# Patient Record
Sex: Female | Born: 1965
Health system: Southern US, Community
[De-identification: ages and names within clinical notes are randomized; demographics above are authoritative.]

## PROBLEM LIST (undated history)

## (undated) DIAGNOSIS — Z8719 Personal history of other diseases of the digestive system: Secondary | ICD-10-CM

## (undated) DIAGNOSIS — Z87440 Personal history of urinary (tract) infections: Secondary | ICD-10-CM

## (undated) DIAGNOSIS — N760 Acute vaginitis: Secondary | ICD-10-CM

## (undated) DIAGNOSIS — R011 Cardiac murmur, unspecified: Secondary | ICD-10-CM

## (undated) DIAGNOSIS — T7840XA Allergy, unspecified, initial encounter: Secondary | ICD-10-CM

## (undated) DIAGNOSIS — Z87898 Personal history of other specified conditions: Secondary | ICD-10-CM

## (undated) DIAGNOSIS — N943 Premenstrual tension syndrome: Secondary | ICD-10-CM

## (undated) DIAGNOSIS — B9689 Other specified bacterial agents as the cause of diseases classified elsewhere: Secondary | ICD-10-CM

## (undated) DIAGNOSIS — Z8619 Personal history of other infectious and parasitic diseases: Secondary | ICD-10-CM

## (undated) DIAGNOSIS — Z8742 Personal history of other diseases of the female genital tract: Secondary | ICD-10-CM

## (undated) DIAGNOSIS — B379 Candidiasis, unspecified: Secondary | ICD-10-CM

## (undated) DIAGNOSIS — N898 Other specified noninflammatory disorders of vagina: Secondary | ICD-10-CM

## (undated) HISTORY — DX: Personal history of other infectious and parasitic diseases: Z86.19

## (undated) HISTORY — DX: Personal history of other diseases of the digestive system: Z87.19

## (undated) HISTORY — DX: Acute vaginitis: N76.0

## (undated) HISTORY — DX: Candidiasis, unspecified: B37.9

## (undated) HISTORY — DX: Personal history of other diseases of the female genital tract: Z87.42

## (undated) HISTORY — DX: Personal history of urinary (tract) infections: Z87.440

## (undated) HISTORY — DX: Personal history of other specified conditions: Z87.898

## (undated) HISTORY — DX: Other specified bacterial agents as the cause of diseases classified elsewhere: B96.89

## (undated) HISTORY — DX: Premenstrual tension syndrome: N94.3

## (undated) HISTORY — DX: Other specified noninflammatory disorders of vagina: N89.8

## (undated) HISTORY — DX: Allergy, unspecified, initial encounter: T78.40XA

## (undated) HISTORY — DX: Cardiac murmur, unspecified: R01.1

---

## 1997-10-01 ENCOUNTER — Other Ambulatory Visit: Admission: RE | Admit: 1997-10-01 | Discharge: 1997-10-01 | Payer: Self-pay | Admitting: Obstetrics and Gynecology

## 1998-03-22 HISTORY — PX: DILATION AND CURETTAGE OF UTERUS: SHX78

## 1998-10-21 HISTORY — PX: WISDOM TOOTH EXTRACTION: SHX21

## 1998-10-23 ENCOUNTER — Other Ambulatory Visit: Admission: RE | Admit: 1998-10-23 | Discharge: 1998-10-23 | Payer: Self-pay | Admitting: Obstetrics and Gynecology

## 1999-01-02 ENCOUNTER — Ambulatory Visit (HOSPITAL_COMMUNITY): Admission: RE | Admit: 1999-01-02 | Discharge: 1999-01-02 | Payer: Self-pay | Admitting: *Deleted

## 1999-01-02 ENCOUNTER — Encounter (INDEPENDENT_AMBULATORY_CARE_PROVIDER_SITE_OTHER): Payer: Self-pay | Admitting: Specialist

## 1999-06-26 ENCOUNTER — Ambulatory Visit (HOSPITAL_COMMUNITY): Admission: RE | Admit: 1999-06-26 | Discharge: 1999-06-26 | Payer: Self-pay | Admitting: *Deleted

## 2000-03-22 DIAGNOSIS — N898 Other specified noninflammatory disorders of vagina: Secondary | ICD-10-CM

## 2000-03-22 HISTORY — DX: Other specified noninflammatory disorders of vagina: N89.8

## 2000-07-22 ENCOUNTER — Inpatient Hospital Stay (HOSPITAL_COMMUNITY): Admission: AD | Admit: 2000-07-22 | Discharge: 2000-07-22 | Payer: Self-pay | Admitting: *Deleted

## 2000-07-22 ENCOUNTER — Inpatient Hospital Stay (HOSPITAL_COMMUNITY): Admission: AD | Admit: 2000-07-22 | Discharge: 2000-07-25 | Payer: Self-pay | Admitting: Obstetrics and Gynecology

## 2001-03-22 DIAGNOSIS — N760 Acute vaginitis: Secondary | ICD-10-CM

## 2001-03-22 DIAGNOSIS — B9689 Other specified bacterial agents as the cause of diseases classified elsewhere: Secondary | ICD-10-CM

## 2001-03-22 HISTORY — DX: Other specified bacterial agents as the cause of diseases classified elsewhere: B96.89

## 2001-03-22 HISTORY — DX: Other specified bacterial agents as the cause of diseases classified elsewhere: N76.0

## 2001-10-20 DIAGNOSIS — Z8742 Personal history of other diseases of the female genital tract: Secondary | ICD-10-CM

## 2001-10-20 HISTORY — DX: Personal history of other diseases of the female genital tract: Z87.42

## 2001-12-29 ENCOUNTER — Ambulatory Visit (HOSPITAL_COMMUNITY): Admission: RE | Admit: 2001-12-29 | Discharge: 2001-12-29 | Payer: Self-pay | Admitting: Obstetrics and Gynecology

## 2001-12-29 ENCOUNTER — Encounter: Payer: Self-pay | Admitting: Obstetrics and Gynecology

## 2002-02-13 ENCOUNTER — Ambulatory Visit (HOSPITAL_COMMUNITY): Admission: RE | Admit: 2002-02-13 | Discharge: 2002-02-13 | Payer: Self-pay | Admitting: Obstetrics and Gynecology

## 2002-02-13 ENCOUNTER — Encounter (INDEPENDENT_AMBULATORY_CARE_PROVIDER_SITE_OTHER): Payer: Self-pay | Admitting: *Deleted

## 2003-05-06 ENCOUNTER — Other Ambulatory Visit: Admission: RE | Admit: 2003-05-06 | Discharge: 2003-05-06 | Payer: Self-pay | Admitting: Obstetrics and Gynecology

## 2003-08-07 DIAGNOSIS — Z8742 Personal history of other diseases of the female genital tract: Secondary | ICD-10-CM

## 2003-08-07 HISTORY — DX: Personal history of other diseases of the female genital tract: Z87.42

## 2003-08-21 DIAGNOSIS — Z87898 Personal history of other specified conditions: Secondary | ICD-10-CM

## 2003-08-21 HISTORY — DX: Personal history of other specified conditions: Z87.898

## 2003-08-26 ENCOUNTER — Ambulatory Visit (HOSPITAL_COMMUNITY): Admission: RE | Admit: 2003-08-26 | Discharge: 2003-08-26 | Payer: Self-pay | Admitting: Obstetrics and Gynecology

## 2004-10-20 DIAGNOSIS — N943 Premenstrual tension syndrome: Secondary | ICD-10-CM

## 2004-10-20 HISTORY — DX: Premenstrual tension syndrome: N94.3

## 2004-11-11 ENCOUNTER — Other Ambulatory Visit: Admission: RE | Admit: 2004-11-11 | Discharge: 2004-11-11 | Payer: Self-pay | Admitting: Obstetrics and Gynecology

## 2005-11-15 ENCOUNTER — Other Ambulatory Visit: Admission: RE | Admit: 2005-11-15 | Discharge: 2005-11-15 | Payer: Self-pay | Admitting: Obstetrics and Gynecology

## 2005-11-24 ENCOUNTER — Ambulatory Visit (HOSPITAL_COMMUNITY): Admission: RE | Admit: 2005-11-24 | Discharge: 2005-11-24 | Payer: Self-pay | Admitting: Obstetrics and Gynecology

## 2006-04-22 DIAGNOSIS — Z87440 Personal history of urinary (tract) infections: Secondary | ICD-10-CM

## 2006-04-22 HISTORY — DX: Personal history of urinary (tract) infections: Z87.440

## 2006-06-21 DIAGNOSIS — B379 Candidiasis, unspecified: Secondary | ICD-10-CM | POA: Insufficient documentation

## 2006-06-21 HISTORY — DX: Candidiasis, unspecified: B37.9

## 2006-08-08 ENCOUNTER — Encounter: Admission: RE | Admit: 2006-08-08 | Discharge: 2006-08-08 | Payer: Self-pay | Admitting: Obstetrics and Gynecology

## 2006-08-12 ENCOUNTER — Encounter: Admission: RE | Admit: 2006-08-12 | Discharge: 2006-08-12 | Payer: Self-pay | Admitting: Obstetrics and Gynecology

## 2007-01-10 ENCOUNTER — Encounter (INDEPENDENT_AMBULATORY_CARE_PROVIDER_SITE_OTHER): Payer: Self-pay | Admitting: Obstetrics and Gynecology

## 2007-01-10 ENCOUNTER — Ambulatory Visit (HOSPITAL_COMMUNITY): Admission: RE | Admit: 2007-01-10 | Discharge: 2007-01-10 | Payer: Self-pay | Admitting: Obstetrics and Gynecology

## 2007-01-10 HISTORY — PX: CYSTECTOMY: SUR359

## 2009-06-26 ENCOUNTER — Ambulatory Visit (HOSPITAL_COMMUNITY): Admission: RE | Admit: 2009-06-26 | Discharge: 2009-06-26 | Payer: Self-pay | Admitting: Obstetrics and Gynecology

## 2010-04-12 ENCOUNTER — Encounter: Payer: Self-pay | Admitting: Obstetrics and Gynecology

## 2010-06-21 DIAGNOSIS — Z87898 Personal history of other specified conditions: Secondary | ICD-10-CM

## 2010-06-21 DIAGNOSIS — Z8719 Personal history of other diseases of the digestive system: Secondary | ICD-10-CM | POA: Insufficient documentation

## 2010-06-21 HISTORY — DX: Personal history of other diseases of the digestive system: Z87.19

## 2010-06-21 HISTORY — DX: Personal history of other specified conditions: Z87.898

## 2010-08-04 NOTE — H&P (Signed)
NAME:  Stacy Young, Stacy Young NO.:  [id number]   MEDICAL RECORD NO.:  [id number]            PATIENT TYPE:   LOCATION:                                FACILITY:  [facility]   PHYSICIAN:  Janine Limbo, M.D.DATE OF BIRTH:  November 14, 1965   DATE OF ADMISSION:  01/10/2007  DATE OF DISCHARGE:                              HISTORY & PHYSICAL   HISTORY OF PRESENT ILLNESS:  Ms.  Stacy Young is a 45 year old female, para 1-  0-1-1, who presents for a diagnostic laparoscopy and a right ovarian  cystectomy.  The patient has been followed at the [facility] of [facility].  The patient has a history of a 3 cm right adnexal cyst.  The cyst is  consistent with a dermoid cyst.  In 2003, the patient had a diagnostic  laparoscopy with removal of a left dermoid cyst.  The patient has a  known history of endometriosis.  She had a diagnostic laparoscopy in  2001 at which time endometriosis was noted.  Pelvic adhesions were seen  with prior laparoscopies.   OBSTETRICAL HISTORY:  1. In 2000, the patient had a first trimester miscarriage followed by      dilatation and curettage.  2. In 2002, the patient had a vaginal delivery at term.   PAST MEDICAL HISTORY:  The patient denies hypertension and diabetes.   DRUG ALLERGIES:  No known drug allergies, but VICODIN does cause nausea.  The patient is allergic to LATEX.  She denies Betadine allergies.   SOCIAL HISTORY:  The patient denies cigarette use, alcohol use, and  recreational drug use.   REVIEW OF SYSTEMS:  Please see History of Present Illness.   FAMILY HISTORY:  The patient's father had a myocardial infarction.  The  patient's father, sister, and other family members have hypertension.   PHYSICAL EXAMINATION:  VITAL SIGNS:  Weight is 150 pounds.  HEENT:  Within normal limits.  CHEST:  Clear.  HEART:  Regular rate and rhythm.  BREASTS:  Without masses.  ABDOMEN:  Nontender.  EXTREMITIES:  Grossly normal.  NEUROLOGIC:  Exam is grossly normal.  PELVIC:  External genitalia is normal.  Vagina is normal.  Cervix is  nontender.  Uterus is upper limits of normal size.  Adnexa: No masses,  and rectovaginal exam confirms.   ASSESSMENT:  1. Right ovarian cyst consistent with a dermoid cyst.  2. History of a left dermoid cyst.  3. History of pelvic adhesions.  4. Endometriosis.   PLAN:  The patient will undergo a diagnostic laparoscopy and  laparoscopic right ovarian cystectomy.  She understands the indications  for her surgical procedures, and she accepts the risks of, but not  limited to, anesthetic complications, bleeding, infections, and possible  damage to the surrounding organs.      Janine Limbo, M.D.  Electronically Signed     AVS/MEDQ  D:  01/09/2007  T:  01/09/2007  Job:  [id number]

## 2010-08-04 NOTE — Op Note (Signed)
[name], Stacy Young                 ACCOUNT NO.:  [id number]   MEDICAL RECORD NO.:  [id number]          PATIENT TYPE:  AMB   LOCATION:  SDC                           FACILITY:  [facility]   PHYSICIAN:  Janine Limbo, M.D.DATE OF BIRTH:  11-10-1965   DATE OF PROCEDURE:  01/10/2007  DATE OF DISCHARGE:                               OPERATIVE REPORT   PREOPERATIVE DIAGNOSES:  1. Right ovarian cyst, consistent with a dermoid cyst.  2. History of a left dermoid cyst.  3. History of pelvic adhesions.  4. Endometriosis.   POSTOPERATIVE DIAGNOSIS:  1. Right ovarian endometrioma.  2. Pelvic adhesions.  3. History of endometriosis, left dermoid cyst, and pelvic adhesions.   PROCEDURES:  1. Diagnostic laparoscopy.  2. Laparoscopic lysis of adhesions.  3. Laparoscopic right ovarian cystectomy.  4. Ablation of endometriosis.   SURGEON:  Leonard Schwartz, M.D.   FIRST ASSISTANT:  None.   ANESTHETIC:  General.   DISPOSITION:  Stacy Young is a 45 year old female, para 1-0-1-1, who  presents with the above-mentioned diagnosis.  The patient has had two  diagnostic laparoscopies in the past.  An ultrasound has shown a right  ovarian cyst that appears to be consistent with a dermoid cyst.  The  patient understands the indications for her surgical procedure, and she  accepts the risks of, but not limited to, anesthetic complications,  bleeding, infection, and possible damage to the surrounding organs.   FINDINGS:  The uterus is upper limits normal size.  The left fallopian  tube and the left ovary appear completely normal.  The right fallopian  tube is slightly scarred to the uterus at its proximal portion.  The  distal portion of the right fallopian tube appears normal, and the  fimbriated end on both the right and the left are delicate.  The right  ovary is adhered to the right posterior cul-de-sac.  There was a 3 cm  endometrioma present in that right ovary.  The colon was adhered to  the  right pelvic sidewall.  The anterior cul-de-sac was otherwise free.  There was a 0.5 cm implant of endometriosis present on the right  anterior uterus.  There was no evidence of endometriosis in posterior  cul-de-sac other than the endometrioma on the right ovary that was  adhered to the right posterior uterus.  The appendix could not be  visualized because there were filmy adhesions in the area.  The right  upper abdomen appeared normal.  The liver and the gallbladder appeared  normal.   PROCEDURE:  The patient was taken to the operating room, where a general  anesthetic was given.  The patient's abdomen, perineum, and vagina were  prepped with multiple layers of Betadine.  A Foley catheter was placed  in the bladder.  Examination under anesthesia was performed.  A Hulka  tenaculum was placed inside the uterus.  The patient was then sterilely  draped.  The subumbilical area was injected with 6 mL of 0.5% Marcaine  with epinephrine.  An incision was made in the subumbilical area and  carried sharply through the  subcutaneous tissue, the fascia, and the  anterior peritoneum.  The Hasson cannula was sutured into place using 0  Vicryl.  A pneumoperitoneum was then obtained.  The laparoscope was  inserted.  The pelvis was carefully inspected.  There was no evidence of  damage to the bowel or other vital structures.  The lower abdomen was  injected in two separate places, and a total of 6 mL of 0.5% percent  Marcaine with epinephrine was injected into the skin and fascia.  Two  small incisions were made, and two 5 mm trocars were placed under direct  visualization into the lower abdomen.  We then took pictures of the  patient's abdominal and pelvic structures.  We began our procedure by  lysing the adhesions between the right ovary and the right posterior  uterus.  In the process of lysing these adhesions, dark brown material  passed through the right ovarian cyst, and this was thought to  be  consistent with an endometrioma.  Once the right ovary was freed from  the right posterior uterus, we then opened the right ovary, and the  endometrioma was removed using a combination of blunt and sharp  dissection.  Brisk bleeding was encountered.  Hemostasis was achieved  using the bipolar cautery.  Care was taken not to damage any of the  bowel or other vital structures.  The endometrial implants in the right  anterior cul-de-sac were then cauterized using the bipolar cautery.  The  pelvis was vigorously irrigated.  We were noted to have hemostasis at  this point.  We were ready to terminate our procedure.  The trocars in  the lower abdomen were removed under direct visualization.  The  pneumoperitoneum was allowed to escape.  The Hasson cannula was removed.  The subumbilical fascia was closed using a running suture of 0 Vicryl.  All skin incisions were closed using subcuticular sutures of 4-0  Monocryl.  Sponge, needle, and instrument counts were correct on two  occasions.  The estimated blood loss for the procedure was 100 mL.  The  patient tolerated her procedure well.  The Hulka tenaculum and the Foley  catheter were removed.  The patient was taken to the recovery room in  stable condition.  The estimated surgical time was 1 hour and 40  minutes.   FOLLOW-UP INSTRUCTIONS:  The patient will return to see Dr. Stefano Gaul in  2-3 weeks for follow-up examination.  She was given a copy of the  postoperative instruction sheet as prepared by the [facility]  [facility] for patients who have undergone laparoscopy.  She will take  Motrin 800 mg q.8 h. as needed for mild to moderate pain.  She will take  Darvocet-N 100 q.6 h. as needed for severe pain.  She will call for  questions or concerns.      Janine Limbo, M.D.  Electronically Signed     AVS/MEDQ  D:  01/10/2007  T:  01/11/2007  Job:  [id number]

## 2010-08-07 NOTE — Op Note (Signed)
[place]. [facility]  Patient:    Stacy Young, Stacy Young                        MRN: [id number] Proc. Date: 06/26/99 Adm. Date:  [id number] Disc. Date: [id number] Attending:  Pleas Koch                           Operative Report  PREOPERATIVE DIAGNOSIS:  Complex left adnexal mass.  POSTOPERATIVE DIAGNOSES: 1. Left peritubal cyst. 2. Chronic pelvic adhesions. 3. Endometriosis involving the anterior uterine surface, bladder flap, right    cornual area, cecum, and left cornual area.  OPERATIONS: 1. Laparoscopy lysis of adhesions. 2. Left peritubal cyst excision.  SURGEON:  Beaulah Corin, M.D.  ASSISTANT:  Henreitta Leber, P.A.  BLOOD LOSS:  Less than 50 cc.  COMPLICATIONS:  None.  ANESTHESIA:  General.  FINDINGS:  A 4 cm left peritubal cyst with chocolate material in it, omental adhesions covering the cyst, the anterior wall of the uterus attached to the bladder flap, nodularity of the right corneal area consistent with endometriosis.  INDICATION FOR PROCEDURE:  This is a 45 year old gravida 1, para 0, with diffuse mild intermittent left and right lower quadrant pain, a persistent 4.5 cm complex left adnexal mass was identified and followed over the last six months without change.  The patient is brought in for evaluation of this adnexal mass.  DESCRIPTION OF PROCEDURE:  The patient was taken to the operating room and given a general anesthetic and placed in the dorsolithotomy position and abdomen and perineum, and vagina were prepped and draped sterilely.  The bladder was emptied with a catheter.  Bimanual examination revealed a normal size uterus, a fullness in the left adnexa.  A uterine manipulator was placed in through the cervix and other instruments removed.  A vertical subumbilical incision was made and a Verres needle placed through this and a 2.5 liters of carbon dioxide gas was insufflated creating a pneumoperitoneum. Laparoscopic trocar  and sleeve were introduced and on direct vision a 5 mm suprapubic port and a 5 mm left lower quadrant port were placed.  The following pelvic findings were noted:  There appeared to be no injury from laparoscopic trocar placement.  There was omentum that was adherent over the anterior surface of the uterus to the bladder flap and left and right cornual areas.  There was nodularity of the right cornual area.  The cecum was in close proximity covering the right tube and ovary and in close proximity to the inflammatory mass on the right cornual area.  The appendix was not visualized but did not appear to be involved in this inflammatory mass.  There was thin filmy adhesions which were lysed using harmonic scalpel and irrigation and thin filmy adhesions over the left ovary.  The left ovary was normal without evidence of significant cyst.  A small fluid-filled cyst with clear fluid was noted and spontaneously ruptured during manipulation.  The left ovary and fossa appeared to have some neovascularization consistent with endometriosis but the ovary was mobile.  A mass and peritubal cyst was involved in the left adnexa which was freed with harmonic scalpel, was deflated with the chocolate material egressing from it.  This was then removed through the subumbilical port.  Irrigation and coagulation of the surface of the omentum which was bleeding accomplished hemostasis.  Irrigation of any blood and debris was removed.  There was no active bleeding.  Photo documentation of all findings were noted.  The right tube and ovary appeared normal and other than the proximal portion of the tube appearing slightly nodular, there was no other pathology noted and the end of the tubes appeared normal.  The upper abdomen appeared normal.  With these findings noted, the ports were removed, 10 cc of 1/4% Marcaine was drizzled on the operative sites in the pelvis and 5 cc injected into the laparoscopic ports after  the ports were removed.  The ports were closed with Dexon suture.  Sponge, needle, and instrument counts were correct.  The vaginal instrument removed and the patient returned to the recovery area in good condition. DD:  06/26/99 TD:  06/28/99 Job: [id number]

## 2010-08-07 NOTE — Op Note (Signed)
NAME:  Stacy Young, CANALE                           ACCOUNT NO.:  [id number]   MEDICAL RECORD NO.:  [id number]                   PATIENT TYPE:  AMB   LOCATION:  SDC                                  [facility]ILITY:  [facility]   PHYSICIAN:  Janine Limbo, M.D.            DATE OF BIRTH:  Dec 17, 1965   DATE OF PROCEDURE:  02/13/2002  DATE OF DISCHARGE:                                 OPERATIVE REPORT   PREOPERATIVE DIAGNOSES:  1. Dermoid cyst in the left ovary.  2. Endometriosis.  3. Pelvic adhesions.   POSTOPERATIVE DIAGNOSES:  1. Dermoid cyst in the left ovary.  2. Endometriosis.  3. Pelvic adhesions.   PROCEDURE:  1. Diagnostic laparoscopy.  2. Laparoscopic left ovarian cystectomy.  3. Laparoscopic resection of endometriosis.  4. Laparoscopic lysis of adhesions.   SURGEON:  Janine Limbo, M.D.   ANESTHESIA:  General.   DISPOSITION:  Stacy Young is a 45 year old female, para 1-0-1-1, who presents  with a dermoid cyst as demonstrated by ultrasound. She has a history of  endometriosis.  She understands the indications for her surgical procedure,  and she accepts the risks of, but not limited to, anesthetic complications,  bleeding, infections, and possible damage to the surrounding organs.   FINDINGS:  A 3 cm dermoid cyst was noted in the left ovary.  The patient had  a 1 cm endometriosis lesion in the right posterior cul-de-sac.  There were  moderate adhesions between the right pelvic side wall and the right adnexa  and the large bowel.  The appendix appeared normal except for filmy  adhesions.  The anterior cul-de-sac did not have evidence of endometriosis,  but there was a small amount of scarring present between the round ligament  and the left anterior pelvic side wall.  The left ovary was adhered with  light adhesions to the left pelvic side wall. The liver, the upper abdomen,  and the bowel otherwise appeared normal.  The dermoid cyst was ruptured  during the cystectomy, and  the abdomen was vigorously irrigated.  At the end  of the operative procedure, there was no evidence of material from the  dermoid cyst left in the abdominal cavity.   PROCEDURE:  The patient was taken to the operating room where a general  anesthetic was given.  The patient's abdomen, perineum, and vagina were  prepped with multiple layers of Betadine.  A Foley catheter was placed in  the bladder.  A Hulka tenaculum was placed inside the uterus.  The patient  was then sterilely draped.  The subumbilical area was injected with 5 cc of  0.5% Marcaine with epinephrine.  An incision was made and carried sharply  through the fascia.  The Hasson cannula was sutured into place.  A  pneumoperitoneum was then obtained.  The pelvic structures were visualized  with findings as mentioned above.  Two suprapubic areas were injected with a  total of 5 cc of 0.5% Marcaine.  Two small incisions were made, and two 5 mm  trocars were placed in the lower abdomen under direct visualization.  Again,  the pelvic structures were inspected.  Pictures were taken of the patient's  anatomy.  The area of endometriosis in the right posterior cul-de-sac was  then injected with lactated Ringers.  The area was resected using the  laparoscopic scissors.  Hemostasis was achieved.  The capsule of the ovary  on the left was then cauterized.  The capsule was incised using the  laparoscopic scissors, and the dermoid cyst was then resected.  Sharp  dissection and hydrodissection were used to remove the cyst from the ovary.  The cyst was ruptured during the dissection.  A minimal amount of oil-  appearing material drained from the dermoid cyst.  The 5 mm trocar in the  left lower abdomen was then removed, and a 10 mm trocar was placed.  Again,  this was placed under direct visualization.  An EndoCatch apparatus was then  placed in the pelvis, and the dermoid cyst was dropped in the bag.  The cyst  was then removed from the  abdomen through the left lower quadrant.  A stitch  was placed in the fascia in the left lower quadrant after the 10 mm trocar  was removed.  The 5 mm trocar was then replaced again under direct  visualization.  The pelvis was then vigorously irrigated with approximately  5000 cc of fluid.  Hemostasis was noted to be adequate.  There was no  evidence of material from the dermoid cyst left in the abdominal cavity.  The adhesions between the bowel and the right adnexa and right pelvic side  wall were then lysed using a combination of sharp and hydrodissection.  Again, care was taken not to damage any of the underlying structures.  The  pelvis was again irrigated.  At this point, we felt that our procedure was  complete.  The bowel was carefully inspected, and there was no evidence of  trocar damage.  All instruments were removed.  The left lower quadrant  incision and the umbilical incision were then closed using deep sutures of 0  Vicryl.  The remainder of the incisions were then closed using deep and  superficial sutures of 4-0 Vicryl.  Sponge count, needle count, and  instrument counts were correct on two occasions.  The estimated blood loss  was 20 cc.  The patient tolerated her procedure well.  She was awakened from  her anesthetic and taken to the recovery room in stable condition.   FOLLOWUP INSTRUCTIONS:  The patient will return to see Dr. Stefano Gaul in two  weeks for followup examination.  The patient already has a prescription for  Vicodin at home, and she will take 1-2 tablets every 4 hours as needed for  pain.  She will return to work on December 1.  She will call for questions  or concerns.  She was given a copy of the postoperative instruction sheet as  prepared by the [facility] for patients who have  undergone a diagnostic laparoscopy.                                               Janine Limbo, M.D.   AVS/MEDQ  D:  02/13/2002  T:  02/13/2002  Job:   [id number]

## 2010-08-07 NOTE — H&P (Signed)
NAME:  Stacy, Young                           ACCOUNT NO.:  [id number]   MEDICAL RECORD NO.:  [id number]                   PATIENT TYPE:  AMB   LOCATION:  SDC                                  FACILITY:  [facility]   PHYSICIAN:  Janine Limbo, M.D.            DATE OF BIRTH:  30-Mar-1965   DATE OF ADMISSION:  DATE OF DISCHARGE:                                HISTORY & PHYSICAL   DATE OF SURGERY:  February 14, 2002.   HISTORY OF PRESENT ILLNESS:  The patient is a 45 year old female, para 1-0-1-  1 who presents for diagnostic laparoscopy and left ovarian cystectomy.  The  patient has had several ultrasounds which showed a 3.3 cm left ovarian mass  consistent with a dermoid cyst.  In April 2001 the patient had a diagnostic  laparoscopy with laparoscopic lysis of adhesions.  She was found to have a 4  cm left paratubal cyst with chocolate material within it, consistent with  endometriosis.  Omental adhesions were noted covering that particular  ovarian cyst.  The anterior wall of the uterus was attached to the bladder  flap and nodularity was noted in the right cornual area.  All of these were  consistent with endometriosis.  Biopsy confirmed that.  The patient had a  dilatation and evacuation in 2000 because of a first trimester miscarriage.   PAST MEDICAL HISTORY:  OBSTETRICAL HISTORY:  In 2000 the patient had a first  trimester miscarriage.  In 2002 the patient had a vaginal delivery at term.   PAST MEDICAL HISTORY:  Please see history of present illness.  She denies  hypertension and diabetes.   DRUG ALLERGIES:  VICODIN.   SOCIAL HISTORY:  The patient denies cigarette use, alcohol use, and  recreational drug use.   REVIEW OF SYSTEMS:  Please see history of present illness.   FAMILY HISTORY:  The patient's father had a myocardial infarction. The  patient's father, sister, and other family members have hypertension.  The  patient's maternal grandmother has breast cancer.   PHYSICAL EXAMINATION:  VITAL SIGNS:  Weight 150 pounds.  HEENT:  Within normal limits.  CHEST:  Clear.  HEART:  Regular rate and rhythm.  BREASTS:  Without masses.  ABDOMEN:  Nontender.  EXTREMITIES:  Within normal limits.  NEUROLOGICAL:  Grossly normal.  PELVIC:  External genitalia is normal.  The vagina is normal.  Cervix is  nontender.  Uterus is normal size, shape and consistency, nontender.  Adnexa  with no masses appreciated.  There is tenderness in the left adnexa.   ASSESSMENT:  Left ovarian cyst, 3.3 cm, with findings consistent with a  dermoid cyst.  The patient does have a history of endometriosis and this  could represent an endometrioma, however.   PLAN:  The patient will undergo a diagnostic laparoscopy with laparoscopic  ovarian cystectomy on the left.  She understands the indications for her  surgical procedure  and she accepts the risks of, but not limited to,  anesthetic complications, bleeding, infections and possible damage to the  surrounding organs.                                               Janine Limbo, M.D.    AVS/MEDQ  D:  02/12/2002  T:  02/12/2002  Job:  [id number]

## 2010-08-07 NOTE — H&P (Signed)
[place]. [facility]  Patient:    Stacy Young, Stacy Young                        MRN: [id number] Adm. Date:  [id number] Disc. Date: [id number] Attending:  Pleas Koch                         History and Physical  ADMISSION DIAGNOSIS:          Persistent left ovarian cyst.  HISTORY:                      This is a 45 year old female with a persistent left ovarian cyst, first diagnosed in October of 2000.  The patient had this followed. The patient has minimal symptoms but a persistent 4.5-cm complex cyst consistent with a dermoid cyst.  The patient is admitted for a diagnostic laparoscopy and ovarian cyst.  PAST MEDICAL HISTORY:         The patient is healthy.  She had a D&E for missed Ab in October of 2000 when the cyst was diagnosed.  No active medical problems.  FAMILY HISTORY:               MI, hypertension and maternal grandmother with breast cancer.  ALLERGIES:                    No known drug allergies.  REVIEW OF SYSTEMS:            Otherwise negative.  PHYSICAL EXAMINATION:  GENERAL:                      Well-developed female in no acute distress.  VITAL SIGNS:                  Blood pressure 112/70.  HEENT:                        Normal.  NECK:                         Thyroid not enlarged.  LUNGS:                        Clear.  HEART:                        Regular rate and rhythm.  BREASTS:                      Without masses.  ABDOMEN:                      Soft, nontender, no masses.  PELVIC:                       External genitals, vagina and cervix normal. Uterus normal size, shape and consistency.  Fullness in the left adnexa; normal right adnexa.  Tenderness in the left adnexa.  PLAN:                         Patient is admitted for a diagnostic laparoscopy nd left ovarian cystectomy and possible laparotomy.  She understands the risks and  complications including cyst rupture, recurrence, minimal malignant  potential, bowel, bladder or vascular  injury, wound infection and anesthetic complications. She is willing to accept these and is willing to proceed. DD:  06/25/99 TD:  06/26/99 Job: [id number]

## 2010-08-07 NOTE — H&P (Signed)
[facility]  Patient:    Stacy Young, Stacy Young                          MRN: [id number] Adm. Date:  07/22/00 Attending:  Janine Limbo, M.D. Dictator:   Nigel Bridgeman, C.N.M.                         History and Physical  HISTORY OF PRESENT ILLNESS:   Ms. Hitzeman is a 45 year old, gravida 2, para 0-0-1-0, at 40-5/7 weeks, who presents for induction secondary to post dates and a nonreactive NST in the office and in maternity admissions.  She had a few mild variables on the tracing in MAU, but the tracing was overall reassuring.  The cervix was 1, 60%, vertex at a -1 to 0 station at the office. The decision was made to admit her for induction secondary to post dates and nonreactive NST.  The pregnancy is remarkable for:  1. Family history of Down syndrome.  2. Left adnexal mass which resolved.  3. Family history of PIH. Prenatal Laboratories:  Blood type is A+.  Rh antibody negative.  VDRL nonreactive.  Rubella titer positive.  Hepatitis B surface antigen negative. HIV negative.  GC and chlamydia cultures were negative.  Pap was normal.  The glucose challenge was normal.  AFP was declined.  The hemoglobin upon entry into the practice was 11.9.  It was 11.8 at 27 weeks.  The group B streptococcus culture was negative at 36 weeks.  An EDC of April 28, 200, was established by last menstrual period and was in agreement with ultrasound at approximately 18 weeks.  HISTORY OF PRESENT PREGNANCY: The patient entered care at approximately 9 weeks.  She had an ultrasound at that time to rule out an ectopic.  She had a left adnexal mass that resolved itself on 18 week ultrasound.  She also had a placenta previa that resolved itself by 23-week ultrasound.  During her pregnancy, she had issues with musculoskeletal discomfort.  The rest of her pregnancy was essentially uncomplicated.  OBSTETRICAL HISTORY:          In October of 2000, she had a missed AB that was treated  with a D&E.  She has had no further complications.  She was on oral contraceptives in the past.  She had a left cyst excision with lysis of adhesions in April of 2001.  She had a D&E in October of 2000.  She had endometriosis in the past.  PAST MEDICAL HISTORY:         She reports usual childhood illnesses.  Other surgery includes her wisdom teeth removed in August of 2000.  She has had two to three UTIs since April of 2001.  ALLERGIES:                    She is allergic to [facility] which causes nausea, shaking, and blurred vision.  FAMILY HISTORY:               Her sister had PIH.  Her father had an MI.  Her father, sister, and paternal uncle have hypertension.  A maternal grandmother had breast cancer.  GENETIC HISTORY:              Remarkable for the father of baby sisters son having Down syndrome.  SOCIAL HISTORY:  The patient is married to the father of the baby.  He is involved and supportive.  His name is Maple Hudson, Montez Hageman.  The patient has a college education.  She is employed at [employer] and is also a study full-time in college.  Her husband is a Curator.  She is African-American and ______ in faith.  She has been followed by the certified nurse midwife service at [facility].  She denies any alcohol, drug, or tobacco use during this pregnancy.  PHYSICAL EXAMINATION:         The vital signs are stable.  The patient is afebrile.  HEENT:                        Within normal limits.  LUNGS:                        Bilateral breath sounds are clear.  HEART:                        Regular rate and rhythm without murmur.  BREASTS:                      Soft and nontender.  ABDOMEN:                      The fundal height is approximately 38 cm.  The estimated fetal weight is 8 pounds.  Uterine contractions are very irregular and mild.  PELVIC:                       Cervical Exam:  1 cm, 60%, vertex at a -1 to 0 station in the office.  The fetal heart rate  is reassuring, but nonreactive. There are very occasional mild variables interspersed.  EXTREMITIES:                  The deep tendon reflexes are 2+ without clonus. There is a trace edema noted.  IMPRESSION:                   1. Intrauterine pregnancy at 40-5/7 weeks.                               2. Nonreactive nonstress test.                               3. Post dates.  PLAN:                         1. Admit to birthing suite for consult with                                  Janine Limbo, M.D., as attending                                  physician.                               2. Routine certified nurse midwife orders.  3. Plan Cytotec placement via protocol and then                                  initiation of Pitocin in the a.m. DD:  07/22/00 TD:  07/22/00 Job: [id number]

## 2010-12-25 ENCOUNTER — Other Ambulatory Visit: Payer: Self-pay | Admitting: Obstetrics and Gynecology

## 2010-12-25 DIAGNOSIS — N644 Mastodynia: Secondary | ICD-10-CM

## 2010-12-25 DIAGNOSIS — N63 Unspecified lump in unspecified breast: Secondary | ICD-10-CM

## 2010-12-30 LAB — CBC
MCHC: 34.2
RBC: 4.13
WBC: 4.2

## 2010-12-30 LAB — PREGNANCY, URINE

## 2011-01-12 ENCOUNTER — Other Ambulatory Visit: Payer: Self-pay | Admitting: Obstetrics and Gynecology

## 2011-01-12 ENCOUNTER — Ambulatory Visit
Admission: RE | Admit: 2011-01-12 | Discharge: 2011-01-12 | Disposition: A | Discharge status: Home / Self Care | Payer: 59 | Source: Ambulatory Visit | Attending: Obstetrics and Gynecology | Admitting: Obstetrics and Gynecology

## 2011-01-12 DIAGNOSIS — N644 Mastodynia: Secondary | ICD-10-CM

## 2011-01-12 DIAGNOSIS — N63 Unspecified lump in unspecified breast: Secondary | ICD-10-CM

## 2011-08-17 ENCOUNTER — Telehealth: Payer: Self-pay | Admitting: Obstetrics and Gynecology

## 2011-08-17 NOTE — Telephone Encounter (Signed)
received

## 2011-08-18 ENCOUNTER — Encounter: Payer: Self-pay | Admitting: Obstetrics and Gynecology

## 2011-08-18 ENCOUNTER — Telehealth: Payer: Self-pay | Admitting: *Deleted

## 2011-08-19 ENCOUNTER — Encounter: Payer: Self-pay | Admitting: Obstetrics and Gynecology

## 2011-08-19 ENCOUNTER — Ambulatory Visit (INDEPENDENT_AMBULATORY_CARE_PROVIDER_SITE_OTHER): Payer: 59 | Admitting: Obstetrics and Gynecology

## 2011-08-19 VITALS — BP 100/66 | Temp 98.3°F | Wt 152.5 lb

## 2011-08-19 DIAGNOSIS — N809 Endometriosis, unspecified: Secondary | ICD-10-CM

## 2011-08-19 DIAGNOSIS — R1900 Intra-abdominal and pelvic swelling, mass and lump, unspecified site: Secondary | ICD-10-CM

## 2011-08-19 DIAGNOSIS — Z86018 Personal history of other benign neoplasm: Secondary | ICD-10-CM

## 2011-08-19 DIAGNOSIS — Z9889 Other specified postprocedural states: Secondary | ICD-10-CM

## 2011-08-19 DIAGNOSIS — N949 Unspecified condition associated with female genital organs and menstrual cycle: Secondary | ICD-10-CM

## 2011-08-19 DIAGNOSIS — R102 Pelvic and perineal pain: Secondary | ICD-10-CM

## 2011-08-19 LAB — POCT URINALYSIS DIPSTICK
Bilirubin, UA: NEGATIVE
Glucose, UA: NEGATIVE
Leukocytes, UA: NEGATIVE
Nitrite, UA: NEGATIVE
pH, UA: 7

## 2011-08-19 LAB — POCT URINE PREGNANCY: Preg Test, Ur: NEGATIVE

## 2011-08-19 MED ORDER — IBUPROFEN 800 MG PO TABS: 800.0000 mg | ORAL_TABLET | Freq: Three times a day (TID) | ORAL | Status: DC | PRN

## 2011-08-19 NOTE — Telephone Encounter (Signed)
Received msg:

## 2011-08-19 NOTE — Progress Notes (Signed)
Contraception: IUD Mirena 03/01/2007 History of STD:  no history of PID, STD's History of ovarian cyst: yes:  Had two surgeries 1999 & 2008 per pt  History of fibroids: no History of endometriosis:no Previous ultrasound: no  Urinary symptoms: increased pressure  Gastro-intestinal symptoms:  Constipation: yes     Diarrhea: no     Nausea: yes     Vomiting: no     Fever: no Vaginal discharge: no  Pt states had intermittent R side pelvic & low back pain x 6 months

## 2011-08-19 NOTE — Progress Notes (Signed)
46 YO with a history of dermoid cysts complains of pelvic  and lower back pain for months.  For the past week her achy pain has been concentrated on  the  right side and has been awakening her from sleep.  Occasionally sharp/throbbing pains will occur and lasts for several minutes. Has taken Ibuprofen/Aleve with some relief.  Denies vomiting, diarrhea or  urinary tract symptoms but will occasionally have dyspareunia.   O: Abdomen: non-tender, no masses       Pelvic:  EGBUS-wnl, vagina-normal rugae, cervix-string                   visible, uterus-normal size, non-tender, right                   adnexa with tender firm palpable mass approx.                   3 cm; left adnexa-no masses or tenderness  A: Pelvic Pain     H/O Dermoid Cysts     Right Adnexal Mass  P: pelvic ultrasound to rule out right ovarian cyst      Ibuprofen 800 mg # 30 1 po pc q 8 hours prn-pain      RTO-ultrasound followup  Stacy Nolden, PA-C

## 2011-08-27 ENCOUNTER — Encounter: Payer: Self-pay | Admitting: Obstetrics and Gynecology

## 2011-08-27 ENCOUNTER — Other Ambulatory Visit: Payer: 59

## 2011-08-27 ENCOUNTER — Ambulatory Visit (INDEPENDENT_AMBULATORY_CARE_PROVIDER_SITE_OTHER): Payer: 59

## 2011-08-27 ENCOUNTER — Ambulatory Visit (INDEPENDENT_AMBULATORY_CARE_PROVIDER_SITE_OTHER): Payer: 59 | Admitting: Obstetrics and Gynecology

## 2011-08-27 ENCOUNTER — Other Ambulatory Visit: Payer: Self-pay | Admitting: Obstetrics and Gynecology

## 2011-08-27 VITALS — BP 120/70 | Temp 98.6°F | Ht 68.0 in | Wt 152.0 lb

## 2011-08-27 DIAGNOSIS — N760 Acute vaginitis: Secondary | ICD-10-CM

## 2011-08-27 DIAGNOSIS — B379 Candidiasis, unspecified: Secondary | ICD-10-CM

## 2011-08-27 DIAGNOSIS — N898 Other specified noninflammatory disorders of vagina: Secondary | ICD-10-CM | POA: Insufficient documentation

## 2011-08-27 DIAGNOSIS — N949 Unspecified condition associated with female genital organs and menstrual cycle: Secondary | ICD-10-CM

## 2011-08-27 DIAGNOSIS — A499 Bacterial infection, unspecified: Secondary | ICD-10-CM

## 2011-08-27 DIAGNOSIS — Z87440 Personal history of urinary (tract) infections: Secondary | ICD-10-CM

## 2011-08-27 DIAGNOSIS — B9689 Other specified bacterial agents as the cause of diseases classified elsewhere: Secondary | ICD-10-CM

## 2011-08-27 DIAGNOSIS — R102 Pelvic and perineal pain unspecified side: Secondary | ICD-10-CM

## 2011-08-27 DIAGNOSIS — Z87898 Personal history of other specified conditions: Secondary | ICD-10-CM

## 2011-08-27 DIAGNOSIS — N943 Premenstrual tension syndrome: Secondary | ICD-10-CM

## 2011-08-27 DIAGNOSIS — Z8619 Personal history of other infectious and parasitic diseases: Secondary | ICD-10-CM

## 2011-08-27 DIAGNOSIS — R339 Retention of urine, unspecified: Secondary | ICD-10-CM

## 2011-08-27 DIAGNOSIS — Z8719 Personal history of other diseases of the digestive system: Secondary | ICD-10-CM

## 2011-08-27 DIAGNOSIS — Z8742 Personal history of other diseases of the female genital tract: Secondary | ICD-10-CM

## 2011-08-27 DIAGNOSIS — N9489 Other specified conditions associated with female genital organs and menstrual cycle: Secondary | ICD-10-CM

## 2011-08-27 LAB — POCT URINALYSIS DIPSTICK
Spec Grav, UA: 1.01
pH: 9

## 2011-08-27 NOTE — Progress Notes (Signed)
46 YO presents for ultrasound follow up.  Patient has a history of dermoid cysts and a possible right adnexal mass was palpated on exam last week.  Patient was also complaining of abdominal/back pain for several months on the right side. Urinalysis last week was negative.  O: U/S-uterus-8.43 x 5.71 x 5.19 cm, endometrium 0.65; right ovary-2.57 x 2.27 x 2.59 cm and left ovary-2.14 x 1.45 x 1.80 cm;  IUD was properly placed within endometria cavity per 3-D rendering.  It was noted that though patient emptied her bladder prior to ultrasound, her bladder remained full as visualized by ultrasound.  Patient just voided again about 15 minutes ago Catherterized urine yielded  115 cc  U/A- pH 9.0   SG 1.010 otherwise negative   A: Pelvic/Lower Back Discomfort     ? Urinary Retention  P: urine for culture      To consult M.D. about possible urodynamics or referral    to Urologist     RTO as scheduled

## 2011-08-31 ENCOUNTER — Telehealth: Payer: Self-pay | Admitting: Obstetrics and Gynecology

## 2011-08-31 NOTE — Telephone Encounter (Signed)
Triage/elect. tst res.

## 2011-09-01 NOTE — Telephone Encounter (Signed)
Lm on vm tcb rgd msg 

## 2011-09-03 ENCOUNTER — Telehealth: Payer: Self-pay

## 2011-09-03 NOTE — Telephone Encounter (Signed)
TC TO PT REGARDING URINE CULTURE. PER EP INFORMED PT THAT URINE CULTURE WAS WNL AND THE DR. RECOMMENDS A LUMAX. EXPLAINED WHAT LUMAX IS AND TOLD PT THAT DEBORAH WILL CALL HER TO SCHEDULE. PT VOICED UNDERSTANDING AND GAVE THE FOLLOWING DATES.  09/07/11;ANYTIME     09/21/11 AM         7/3/13AM       10/04/11    IN THE AFTERNOON

## 2011-09-07 ENCOUNTER — Telehealth: Payer: Self-pay | Admitting: Obstetrics and Gynecology

## 2011-09-07 NOTE — Telephone Encounter (Signed)
Call to pt to schedule a lumax per EPowell PA left message for pt to call back DF

## 2011-09-07 NOTE — Telephone Encounter (Signed)
Left message informing patient that the Lumax test would be scheduled by Sheran Lawless, nursing supervisor and that she would give her a call about the time and date.  Also made her aware that I have left a message with Gavin Pound that she had called. Daylin Gruszka, PA-C

## 2011-09-07 NOTE — Telephone Encounter (Signed)
EP pt. Clydene Fake res. cht received

## 2011-09-15 ENCOUNTER — Telehealth: Payer: Self-pay | Admitting: Obstetrics and Gynecology

## 2011-09-15 NOTE — Telephone Encounter (Signed)
Patient with history of urinary retention and a negative recent urine culture is to be scheduled for a LUMAX.  Request sent to D. Faulconer, R.N. for scheduling ASAP.  Truong Delcastillo, PA-C

## 2011-09-15 NOTE — Telephone Encounter (Signed)
Stacy Young/per Winfred Leeds

## 2011-09-16 ENCOUNTER — Ambulatory Visit: Payer: Self-pay | Admitting: Obstetrics and Gynecology

## 2011-09-16 ENCOUNTER — Telehealth: Payer: Self-pay | Admitting: Obstetrics and Gynecology

## 2011-10-11 ENCOUNTER — Telehealth: Payer: Self-pay | Admitting: Obstetrics and Gynecology

## 2011-10-11 NOTE — Telephone Encounter (Signed)
Call to pt we have been calling each other still trying to schedule pt for Lumax left message to call me @ office DFaulconer RN

## 2011-10-19 ENCOUNTER — Telehealth: Payer: Self-pay | Admitting: Obstetrics and Gynecology

## 2011-10-19 NOTE — Telephone Encounter (Signed)
Finally able to get intouch with pt ,I have left multiple messages for pt to call so we can schedule the Lumax testing.scheduled for 11/30/11 @9 :30a f/u visit with AR @ 10:30a DF

## 2011-10-19 NOTE — Telephone Encounter (Signed)
done

## 2011-11-29 ENCOUNTER — Telehealth: Payer: Self-pay | Admitting: Obstetrics and Gynecology

## 2011-11-29 NOTE — Telephone Encounter (Signed)
Benefits verified for Lumax with Lennox Pippins @ [employer].  Plan effective 01/21/04. Pats 100% after a $35 copayment. Reference # [id number] -Adrianne Pridgen

## 2011-11-30 ENCOUNTER — Ambulatory Visit (INDEPENDENT_AMBULATORY_CARE_PROVIDER_SITE_OTHER): Payer: 59 | Admitting: Obstetrics and Gynecology

## 2011-11-30 ENCOUNTER — Encounter: Payer: Self-pay | Admitting: Obstetrics and Gynecology

## 2011-11-30 VITALS — BP 110/76 | Resp 16 | Ht 67.0 in | Wt 150.0 lb

## 2011-11-30 DIAGNOSIS — R339 Retention of urine, unspecified: Secondary | ICD-10-CM

## 2011-11-30 NOTE — Progress Notes (Signed)
?    Urinary Retention after u/s when seen in June by EP S/p cystometrics today PVR 30cc No incontinence noted Pt occas feels urinary retention UCx neg in June  Filed Vitals:   11/30/11 1045  BP: 110/76  Resp: 16   A/P ?Episodic urine retention - rec kegel exercises and not to hold urine more than 4hrs. If no improvement or persists and pt wants further eval, will refer to urology for urinary retention

## 2011-12-17 ENCOUNTER — Other Ambulatory Visit: Payer: Self-pay | Admitting: Obstetrics and Gynecology

## 2011-12-17 DIAGNOSIS — Z1231 Encounter for screening mammogram for malignant neoplasm of breast: Secondary | ICD-10-CM

## 2012-01-31 ENCOUNTER — Ambulatory Visit
Admission: RE | Admit: 2012-01-31 | Discharge: 2012-01-31 | Disposition: A | Discharge status: Home / Self Care | Payer: 59 | Source: Ambulatory Visit | Attending: Obstetrics and Gynecology | Admitting: Obstetrics and Gynecology

## 2012-01-31 DIAGNOSIS — Z1231 Encounter for screening mammogram for malignant neoplasm of breast: Secondary | ICD-10-CM

## 2012-02-14 ENCOUNTER — Encounter: Payer: Self-pay | Admitting: Obstetrics and Gynecology

## 2012-02-23 ENCOUNTER — Telehealth: Payer: Self-pay | Admitting: Obstetrics and Gynecology

## 2012-02-23 MED ORDER — NONFORMULARY OR COMPOUNDED ITEM: Status: DC

## 2012-02-23 NOTE — Telephone Encounter (Signed)
TC TO PT REGARDING MESSAGE. PT WANT TO GET RX BORIC ACID. INFORMED PT THAT I WILL CALL IN RX TO PT PHARMACY. PT VOICED UNDERSTANDING.

## 2012-03-31 ENCOUNTER — Ambulatory Visit (INDEPENDENT_AMBULATORY_CARE_PROVIDER_SITE_OTHER): Payer: 59 | Admitting: Obstetrics and Gynecology

## 2012-03-31 ENCOUNTER — Encounter: Payer: Self-pay | Admitting: Obstetrics and Gynecology

## 2012-03-31 VITALS — BP 112/80 | HR 82 | Wt 156.0 lb

## 2012-03-31 DIAGNOSIS — Z30433 Encounter for removal and reinsertion of intrauterine contraceptive device: Secondary | ICD-10-CM

## 2012-03-31 MED ORDER — LEVONORGESTREL 20 MCG/24HR IU IUD
INTRAUTERINE_SYSTEM | Freq: Once | INTRAUTERINE | Status: AC
  Administered 2012-03-31: 1 via INTRAUTERINE

## 2012-03-31 NOTE — Progress Notes (Signed)
IUD INSERTION NOTE  Stacy Young is a 47 y.o. female G2P1011 who presents for IUD removal and reinsertion.  Last insertion was 03/01/2007.  Patient reports a 4/10 crampiness on right side since last night.  Consent signed after risks and benefits were reviewed including but not limited to bleeding, infection, expulsion and risk of uterine perforation that may require an additional procedure for removal.  LMP: Patient's last menstrual period was 03/27/2012. UPT: negative   Uterus assessed for size and position Prepped with Betadine Tenaculum placed on anterior lip of cervix after Hurricane gel was applied Uterus sounded at  7 cm Insertion of MIRENA IUD per protocol without any complications Strings trimmed   Assessment:  IUD Insertion (Mirena,  Lot # [id number])  Plan:  1. Patient instructed to call with oral temperature of 100.4 degrees Fahrenheit or more, excessive bleeding or pain that is not relieved with OTC analgesia taken as directed  2. Patient instructed on how  to check IUD strings and encouraged to do so after each menstrual cycle  3. Advised not to place anything in vagina or have sexual intercourse for 7 days  4. Follow-up:  4 weeks   Helana Macbride PA-C 03/31/2012 2:44 PM

## 2012-03-31 NOTE — Patient Instructions (Signed)
Call [facility] [phone]:  -for temperature of 100.4 degrees Fahrenheit or more -pain not improved with over the counter pain medications (Ibuprofen, Advil, Aleve,        Tylenol or acetaminophen) -for excessive bleeding (more than a usual period) -for any other concerns  Do not place anything in your vagina for the next 7 days    

## 2012-12-26 ENCOUNTER — Other Ambulatory Visit: Payer: Self-pay

## 2012-12-26 DIAGNOSIS — Z1231 Encounter for screening mammogram for malignant neoplasm of breast: Secondary | ICD-10-CM

## 2013-02-06 ENCOUNTER — Ambulatory Visit
Admission: RE | Admit: 2013-02-06 | Discharge: 2013-02-06 | Disposition: A | Discharge status: Home / Self Care | Payer: 59 | Source: Ambulatory Visit

## 2013-02-06 DIAGNOSIS — Z1231 Encounter for screening mammogram for malignant neoplasm of breast: Secondary | ICD-10-CM

## 2014-01-21 ENCOUNTER — Encounter: Payer: Self-pay | Admitting: Obstetrics and Gynecology

## 2014-08-13 ENCOUNTER — Ambulatory Visit (INDEPENDENT_AMBULATORY_CARE_PROVIDER_SITE_OTHER): Payer: 59 | Admitting: Sports Medicine

## 2014-08-13 ENCOUNTER — Encounter: Payer: Self-pay | Admitting: Sports Medicine

## 2014-08-13 VITALS — BP 125/80 | HR 56 | Ht 67.0 in | Wt 142.0 lb

## 2014-08-13 DIAGNOSIS — D72819 Decreased white blood cell count, unspecified: Secondary | ICD-10-CM

## 2014-08-13 DIAGNOSIS — Z0000 Encounter for general adult medical examination without abnormal findings: Secondary | ICD-10-CM | POA: Diagnosis not present

## 2014-08-13 DIAGNOSIS — M25531 Pain in right wrist: Secondary | ICD-10-CM | POA: Diagnosis not present

## 2014-08-13 DIAGNOSIS — L219 Seborrheic dermatitis, unspecified: Secondary | ICD-10-CM | POA: Insufficient documentation

## 2014-08-13 DIAGNOSIS — M1811 Unilateral primary osteoarthritis of first carpometacarpal joint, right hand: Secondary | ICD-10-CM | POA: Insufficient documentation

## 2014-08-13 DIAGNOSIS — L218 Other seborrheic dermatitis: Secondary | ICD-10-CM | POA: Diagnosis not present

## 2014-08-13 DIAGNOSIS — M542 Cervicalgia: Secondary | ICD-10-CM | POA: Diagnosis not present

## 2014-08-13 LAB — COMPREHENSIVE METABOLIC PANEL
ALT: 13 U/L (ref 0–35)
CO2: 27 mEq/L (ref 19–32)
Calcium: 9.6 mg/dL (ref 8.4–10.5)
Creat: 0.76 mg/dL (ref 0.50–1.10)
Glucose, Bld: 81 mg/dL (ref 70–99)
Potassium: 4.5 mEq/L (ref 3.5–5.3)
Sodium: 139 mEq/L (ref 135–145)

## 2014-08-13 LAB — CBC
HCT: 40.3 % (ref 36.0–46.0)
Hemoglobin: 13.1 g/dL (ref 12.0–15.0)
MCH: 28.7 pg (ref 26.0–34.0)
MCHC: 32.5 g/dL (ref 30.0–36.0)
MCV: 88.2 fL (ref 78.0–100.0)
MPV: 10.3 fL (ref 8.6–12.4)
Platelets: 288 K/uL (ref 150–400)
RBC: 4.57 MIL/uL (ref 3.87–5.11)
RDW: 13.9 % (ref 11.5–15.5)
WBC: 3.3 10*3/uL — ABNORMAL LOW (ref 4.0–10.5)

## 2014-08-13 LAB — COMPREHENSIVE METABOLIC PANEL WITH GFR
AST: 28 U/L (ref 0–37)
Albumin: 4.2 g/dL (ref 3.5–5.2)
Alkaline Phosphatase: 57 U/L (ref 39–117)
BUN: 12 mg/dL (ref 6–23)
Chloride: 103 meq/L (ref 96–112)
Total Bilirubin: 0.7 mg/dL (ref 0.2–1.2)
Total Protein: 7.4 g/dL (ref 6.0–8.3)

## 2014-08-13 LAB — LIPID PANEL
Cholesterol: 164 mg/dL (ref 0–200)
HDL: 71 mg/dL (ref >=46)
LDL Cholesterol: 85 mg/dL (ref 0–99)
Total CHOL/HDL Ratio: 2.3 Ratio
Triglycerides: 38 mg/dL (ref <150)
VLDL: 8 mg/dL (ref 0–40)

## 2014-08-13 LAB — HEMOGLOBIN A1C
Hgb A1c MFr Bld: 5.5 % (ref <5.7)
Mean Plasma Glucose: 111 mg/dL (ref <117)

## 2014-08-13 MED ORDER — MELOXICAM 15 MG PO TABS: ORAL_TABLET | ORAL | Status: DC

## 2014-08-13 NOTE — Assessment & Plan Note (Signed)
Checking routine blood work.  Up-to-date on screening measures.

## 2014-08-13 NOTE — Progress Notes (Signed)
  Subjective:    CC: Establish care.   HPI:  Right hand pain: Localize the trapeziometacarpal joint, moderate, persistent, overall well controlled with over-the-counter NSAIDs.  Neck pain: Left-sided, mild, amenable to try some rehabilitation exercises.  Scalp lesions/dandruff: Wonders what can be done, thinks changing her diet has helped.  Yeast overgrowth: No confirmation of this diagnosis however patient feels as though she is better since changing her diet and decreasing her carb intake.  Past medical history, Surgical history, Family history not pertinant except as noted below, Social history, Allergies, and medications have been entered into the medical record, reviewed, and no changes needed.   Review of Systems: No headache, visual changes, nausea, vomiting, diarrhea, constipation, dizziness, abdominal pain, skin rash, fevers, chills, night sweats, swollen lymph nodes, weight loss, chest pain, body aches, joint swelling, muscle aches, shortness of breath, mood changes, visual or auditory hallucinations.  Objective:    General: Well Developed, well nourished, and in no acute distress.  Neuro: Alert and oriented x3, extra-ocular muscles intact, sensation grossly intact.  HEENT: Normocephalic, atraumatic, pupils equal round reactive to light, neck supple, no masses, no lymphadenopathy, thyroid nonpalpable.  Skin: Warm and dry, no rashes noted.  Cardiac: Regular rate and rhythm, no murmurs rubs or gallops.  Respiratory: Clear to auscultation bilaterally. Not using accessory muscles, speaking in full sentences.  Abdominal: Soft, nontender, nondistended, positive bowel sounds, no masses, no organomegaly.  Musculoskeletal: Shoulder, elbow, wrist, hip, knee, ankle stable, and with full range of motion.  Impression and Recommendations:    The patient was counselled, risk factors were discussed, anticipatory guidance given.

## 2014-08-13 NOTE — Assessment & Plan Note (Signed)
Mobic, neck rehabilitation exercises. Return in one month if no better. Patient declines x-rays.

## 2014-08-13 NOTE — Assessment & Plan Note (Signed)
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  Adding meloxicam. Patient declines x-rays.

## 2014-08-13 NOTE — Assessment & Plan Note (Signed)
We can use salicylate creams, however she would like to try dietary changes first.

## 2014-08-14 DIAGNOSIS — D72819 Decreased white blood cell count, unspecified: Secondary | ICD-10-CM | POA: Insufficient documentation

## 2014-08-14 LAB — VITAMIN D 25 HYDROXY (VIT D DEFICIENCY, FRACTURES): Vit D, 25-Hydroxy: 66 ng/mL (ref 30–100)

## 2014-08-14 LAB — TSH: TSH: 1.921 u[IU]/mL (ref 0.350–4.500)

## 2014-08-14 NOTE — Assessment & Plan Note (Signed)
Asymptomatic, rechecking CBC in a month.

## 2014-08-14 NOTE — Addendum Note (Signed)
Addended by: Monica BectonHEKKEKANDAM, THOMAS J on: 08/14/2014 10:03 AM   Modules accepted: Orders

## 2014-09-10 ENCOUNTER — Ambulatory Visit (INDEPENDENT_AMBULATORY_CARE_PROVIDER_SITE_OTHER): Payer: 59 | Admitting: Sports Medicine

## 2014-09-10 ENCOUNTER — Encounter: Payer: Self-pay | Admitting: Sports Medicine

## 2014-09-10 VITALS — BP 128/73 | HR 76 | Ht 67.0 in | Wt 141.0 lb

## 2014-09-10 DIAGNOSIS — M25531 Pain in right wrist: Secondary | ICD-10-CM

## 2014-09-10 DIAGNOSIS — D72819 Decreased white blood cell count, unspecified: Secondary | ICD-10-CM | POA: Diagnosis not present

## 2014-09-10 DIAGNOSIS — L819 Disorder of pigmentation, unspecified: Secondary | ICD-10-CM | POA: Insufficient documentation

## 2014-09-10 DIAGNOSIS — N809 Endometriosis, unspecified: Secondary | ICD-10-CM

## 2014-09-10 DIAGNOSIS — M542 Cervicalgia: Secondary | ICD-10-CM | POA: Diagnosis not present

## 2014-09-10 DIAGNOSIS — Z0000 Encounter for general adult medical examination without abnormal findings: Secondary | ICD-10-CM

## 2014-09-10 DIAGNOSIS — L218 Other seborrheic dermatitis: Secondary | ICD-10-CM

## 2014-09-10 DIAGNOSIS — L219 Seborrheic dermatitis, unspecified: Secondary | ICD-10-CM

## 2014-09-10 DIAGNOSIS — L810 Postinflammatory hyperpigmentation: Secondary | ICD-10-CM | POA: Insufficient documentation

## 2014-09-10 MED ORDER — TRIAMCINOLONE ACETONIDE 0.5 % EX CREA: 1.0000 "application " | TOPICAL_CREAM | Freq: Two times a day (BID) | CUTANEOUS | Status: DC

## 2014-09-10 NOTE — Assessment & Plan Note (Signed)
Up to date on cervical cancer screening

## 2014-09-10 NOTE — Assessment & Plan Note (Signed)
Resolved using homeopathic oils

## 2014-09-10 NOTE — Assessment & Plan Note (Signed)
Resolved using homeopathic oils 

## 2014-09-10 NOTE — Assessment & Plan Note (Signed)
Rechecking CBC

## 2014-09-10 NOTE — Assessment & Plan Note (Signed)
Pain is overall controlled with birth control, she does have increasing acne, and tells me she does have a history of systems the ovaries but is unsure whether this represents polycystic ovarian syndrome. I'm going to check her testosterone levels for further evaluation.

## 2014-09-10 NOTE — Progress Notes (Signed)
  Subjective:    CC: Follow-up  HPI: Neck and hand pain: Resolved with use of homeopathic oils  Preventative measures: Up-to-date on cervical cancer screening  Skin lesions: They appear to be acne, she has been using some homeopathic oils which supposedly work for everything, she does tell me that she was told that she has cysts on her ovaries but does not carry a diagnosis of polycystic ovarian syndrome.  Leukopenia: No symptoms, due for a recheck.  Rash: Left sided of the abdomen, this is a location where she had a cyst removed in the past, she tells me predominantly it is getting darker.  Past medical history, Surgical history, Family history not pertinant except as noted below, Social history, Allergies, and medications have been entered into the medical record, reviewed, and no changes needed.   Review of Systems: No fevers, chills, night sweats, weight loss, chest pain, or shortness of breath.   Objective:    General: Well Developed, well nourished, and in no acute distress.  Neuro: Alert and oriented x3, extra-ocular muscles intact, sensation grossly intact.  HEENT: Normocephalic, atraumatic, pupils equal round reactive to light, neck supple, no masses, no lymphadenopathy, thyroid nonpalpable.  Skin: Warm and dry, no visible abnormalities with the exception of slight hyperpigmentation around a previous surgical scar. Cardiac: Regular rate and rhythm, no murmurs rubs or gallops, no lower extremity edema.  Respiratory: Clear to auscultation bilaterally. Not using accessory muscles, speaking in full sentences.  Impression and Recommendations:

## 2014-09-10 NOTE — Assessment & Plan Note (Signed)
Post excision of a cyst by another provider. She does have some darkish discoloration most likely related to simple postinflammatory hyperpigmentation. I'm going to add topical times swollen for use twice a day. Patient will take a picture of the rash today, and then in one month for an objective evaluation of her change.

## 2014-09-10 NOTE — Assessment & Plan Note (Signed)
Improving using homeopathic oils

## 2014-09-11 LAB — CBC WITH DIFFERENTIAL/PLATELET
Basophils Absolute: 0 10*3/uL (ref 0.0–0.1)
Basophils Relative: 0 % (ref 0–1)
Eosinophils Absolute: 0 10*3/uL (ref 0.0–0.7)
Eosinophils Relative: 1 % (ref 0–5)
HCT: 37.4 % (ref 36.0–46.0)
Hemoglobin: 12.4 g/dL (ref 12.0–15.0)
Lymphocytes Relative: 40 % (ref 12–46)
Lymphs Abs: 1.8 K/uL (ref 0.7–4.0)
MCH: 28.6 pg (ref 26.0–34.0)
MCHC: 33.2 g/dL (ref 30.0–36.0)
MCV: 86.2 fL (ref 78.0–100.0)
MPV: 10.9 fL (ref 8.6–12.4)
Monocytes Absolute: 0.4 10*3/uL (ref 0.1–1.0)
Monocytes Relative: 9 % (ref 3–12)
Neutro Abs: 2.3 10*3/uL (ref 1.7–7.7)
Neutrophils Relative %: 50 % (ref 43–77)
Platelets: 252 10*3/uL (ref 150–400)
RBC: 4.34 MIL/uL (ref 3.87–5.11)
RDW: 13.5 % (ref 11.5–15.5)
WBC: 4.6 10*3/uL (ref 4.0–10.5)

## 2014-09-11 LAB — TESTOSTERONE, FREE, TOTAL, SHBG
Sex Hormone Binding: 63 nmol/L (ref 17–124)
Testosterone, Free: 7.2 pg/mL — ABNORMAL HIGH (ref 0.6–6.8)
Testosterone-% Free: 1.2 % (ref 0.4–2.4)
Testosterone: 61 ng/dL (ref 10–70)

## 2015-01-30 ENCOUNTER — Ambulatory Visit (INDEPENDENT_AMBULATORY_CARE_PROVIDER_SITE_OTHER): Payer: 59 | Admitting: Sports Medicine

## 2015-01-30 ENCOUNTER — Encounter: Payer: Self-pay | Admitting: Sports Medicine

## 2015-01-30 ENCOUNTER — Ambulatory Visit (INDEPENDENT_AMBULATORY_CARE_PROVIDER_SITE_OTHER): Payer: 59

## 2015-01-30 VITALS — BP 145/65 | HR 84 | Ht 67.0 in | Wt 139.0 lb

## 2015-01-30 DIAGNOSIS — Z975 Presence of (intrauterine) contraceptive device: Secondary | ICD-10-CM | POA: Diagnosis not present

## 2015-01-30 DIAGNOSIS — M545 Low back pain, unspecified: Secondary | ICD-10-CM

## 2015-01-30 DIAGNOSIS — M5136 Other intervertebral disc degeneration, lumbar region: Secondary | ICD-10-CM | POA: Diagnosis not present

## 2015-01-30 DIAGNOSIS — M5416 Radiculopathy, lumbar region: Secondary | ICD-10-CM

## 2015-01-30 LAB — POCT URINALYSIS DIPSTICK
Bilirubin, UA: NEGATIVE
Glucose, UA: NEGATIVE
Ketones, UA: NEGATIVE
Nitrite, UA: NEGATIVE
Protein, UA: NEGATIVE
Spec Grav, UA: 1.015
Urobilinogen, UA: 0.2
pH, UA: 7

## 2015-01-30 MED ORDER — PREDNISONE 50 MG PO TABS: ORAL_TABLET | ORAL | Status: DC

## 2015-01-30 MED ORDER — MELOXICAM 15 MG PO TABS: ORAL_TABLET | ORAL | Status: DC

## 2015-01-30 NOTE — Assessment & Plan Note (Signed)
7 months of pain, on review of an MRI from 2008 she does have L5-S1 degenerative disc disease with L5 on S1 retrolisthesis. Aggressive formal physical therapy, baseline x-rays, prednisone, meloxicam, return to see me in one month, we will proceed to lumbar spine MRI for interventional planning if no better.

## 2015-01-30 NOTE — Progress Notes (Signed)
  Subjective:    CC: Left hip and back pain  HPI: For the past 7 months this pleasant 49 year old phenylacetic pain that she localizes in the left side low back, with radiation down the left leg, and an L5 distribution, moderate, persistent, worse with sitting, flexion, and Valsalva, no constitutional symptoms, bowel or bladder dysfunction or saddle numbness.  Past medical history, Surgical history, Family history not pertinant except as noted below, Social history, Allergies, and medications have been entered into the medical record, reviewed, and no changes needed.   Review of Systems: No fevers, chills, night sweats, weight loss, chest pain, or shortness of breath.   Objective:    General: Well Developed, well nourished, and in no acute distress.  Neuro: Alert and oriented x3, extra-ocular muscles intact, sensation grossly intact.  HEENT: Normocephalic, atraumatic, pupils equal round reactive to light, neck supple, no masses, no lymphadenopathy, thyroid nonpalpable.  Skin: Warm and dry, no rashes. Cardiac: Regular rate and rhythm, no murmurs rubs or gallops, no lower extremity edema.  Respiratory: Clear to auscultation bilaterally. Not using accessory muscles, speaking in full sentences. Back Exam:  Inspection: Unremarkable  Motion: Flexion 45 deg, Extension 45 deg, Side Bending to 45 deg bilaterally,  Rotation to 45 deg bilaterally  SLR laying: Negative  XSLR laying: Negative  Palpable tenderness: None. FABER: negative. Sensory change: Gross sensation intact to all lumbar and sacral dermatomes.  Reflexes: 2+ at both patellar tendons, 2+ at achilles tendons, Babinski's downgoing.  Strength at foot  Plantar-flexion: 5/5 Dorsi-flexion: 5/5 Eversion: 5/5 Inversion: 5/5  Leg strength  Quad: 5/5 Hamstring: 5/5 Hip flexor: 5/5 Hip abductors: 5/5  Gait unremarkable. No costovertebral angle pain, no abdominal pain.  Urinalysis is positive for leukocytes and blood however she is on her  menstrual cycle.  Impression and Recommendations:

## 2015-02-25 ENCOUNTER — Ambulatory Visit: Payer: 59 | Attending: Sports Medicine | Admitting: Physical Therapy

## 2015-02-25 DIAGNOSIS — R29898 Other symptoms and signs involving the musculoskeletal system: Secondary | ICD-10-CM | POA: Insufficient documentation

## 2015-02-25 DIAGNOSIS — M5442 Lumbago with sciatica, left side: Secondary | ICD-10-CM | POA: Diagnosis present

## 2015-02-25 NOTE — Therapy (Signed)
[facility] Rehabilitation [facility] [address]  [address] [place], [place], [address] Phone: [phone]   Fax:  [phone]  Physical Therapy Evaluation  Patient Details  Name: Stacy Young MRN: [id number] Date of Birth: [birth year] Referring Provider: Rodney Langton, MD  Encounter Date: 02/25/2015      PT End of Session - 02/25/15 1855    Visit Number 1   Number of Visits 12   Date for PT Re-Evaluation 04/08/15   PT Start Time 1534   PT Stop Time 1615   PT Time Calculation (min) 41 min   Activity Tolerance Patient tolerated treatment well   Behavior During Therapy [facility] for tasks assessed/performed      Past Medical History  Diagnosis Date  . H/O varicella   . Endometriosis 01/2002  . Vaginal dryness 2002  . BV (bacterial vaginosis) 2003  . Hx of abnormal menstrual cycle 10/2001  . Dermoid cyst 10/2001    left  . H/O dysmenorrhea 08/07/2003  . H/O fatigue 08/2003  . PMS (premenstrual syndrome) 10/2004  . Hx: UTI (urinary tract infection) 04/2006  . Monilia infection 06/2006  . Irregular periods/menstrual cycles 09/2007  . Hx of constipation 06/2010  . H/O pelvic mass 06/2010    Past Surgical History  Procedure Laterality Date  . Wisdom tooth extraction  10/1998  . Cystectomy  01/10/2007  . Dilation and curettage of uterus  2000    There were no vitals filed for this visit.  Visit Diagnosis:  Left-sided low back pain with left-sided sciatica  Left leg weakness      Subjective Assessment - 02/25/15 1540    Subjective Patient reports > 1 year of mild low back pain that has intensifed over the past 6 months to the point where it interfered with her sleep. Saw MD who provided her with initial exercises, steroids and pain meds, Has completed steriods and typically doe snot take pain meds but has been completing the exercises which seem to help manage the pain.   Limitations Sitting   How long can you sit comfortably? 30-40 minutes   Diagnostic tests 01/31/15 x-ray: L5-S1 disc degeneration with endplate osteophyte formation. Mild cortical irregularity is noted of the posterior aspect of the lower endplate of L5. Although these changes may just be related to degenerative changes present, discitis cannot be completely excluded.   Patient Stated Goals To be able to get the last of the pain to go away if possible.   Currently in Pain? No/denies   Pain Score --  Pain typically only when lying down, trying to stand up or squat - then can get up to 8/10   Pain Location Buttocks   Pain Orientation Left   Pain Descriptors / Indicators Sharp   Pain Radiating Towards Radicular pain to knee, slight numbness and tingling down to toes   Pain Onset More than a month ago   Pain Frequency Intermittent   Aggravating Factors  Lying in bed, standing up, squats   Pain Relieving Factors Meds   Effect of Pain on Daily Activities Disrupts sleep at night            [facility] PT Assessment - 02/25/15 1535    Assessment   Medical Diagnosis Left lumbar radiculopathy   Referring Provider Rodney Langton, MD   Onset Date/Surgical Date --  > 1 year   Next MD Visit 02/27/15   Prior Therapy MD prescribed exercises   Balance Screen   Has the patient  fallen in the past 6 months No   Has the patient had a decrease in activity level because of a fear of falling?  No   Is the patient reluctant to leave their home because of a fear of falling?  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    Leisure Walking 15 min, 2x/day; Ride bikes   Observation/Other Assessments   Focus on Therapeutic Outcomes (FOTO)  Lumbar - 63% (37% limitation); Predicted 68% (32% limitation)   ROM / Strength   AROM / PROM / Strength AROM;Strength   AROM   Overall AROM Comments No pain with lumbar ROM   AROM Assessment Site Lumbar   Lumbar Flexion Finger tips to toes    Lumbar Extension 75%   Lumbar - Right Side Bend WFL   Lumbar - Left Side Bend WFL   Lumbar - Right Rotation WFL   Lumbar - Left Rotation [facility]   Strength   Strength Assessment Site Hip   Right/Left Hip Right;Left   Right Hip Flexion 4+/5   Right Hip Extension 4/5   Right Hip ABduction 4+/5   Right Hip ADduction 4+/5   Left Hip Flexion 3+/5   Left Hip Extension 3+/5   Left Hip ABduction 4-/5   Left Hip ADduction 3+/5   Flexibility   Soft Tissue Assessment /Muscle Length yes   Hamstrings mildly tight bilaterally   Quadriceps mildly tight on left   ITB moderately tight on left   Piriformis moderately tight on left   Special Tests    Special Tests Hip Special Tests;Lumbar   Lumbar Tests FABER test;Prone Knee Bend Test;Straight Leg Raise   Hip Special Tests  Ober's Test   FABER test   findings Positive   Side LEft   Prone Knee Bend Test   Findings Positive   Side Left   Straight Leg Raise   Findings Negative   Ober's Test   Findings Positive   Side Left         Today's Treatment  TherEx Review of MD prescribed exercises:   Standing Hamstring stretch - Corrected technique to isolate hip flexion and avoid lumbar flexion   DKTC stretch   Cat/camel stretch   Prone hip extension HEP instruction:   ITB stretch - instructed in standing and supine with strap 3x30"   Hip flexor stretch - instructed in modified thomas stretch and 1/2 kneel stretch 3x30"   SKTC stretch 3x30"   LTR 10x10", bilateral               PT Education - 02/25/15 1854    Education provided Yes   Education Details PT eval findings, initial HEP   Person(s) Educated Patient   Methods Explanation;Demonstration;Handout   Comprehension Verbalized understanding;Returned demonstration             PT Long Term Goals - 02/25/15 1910    PT LONG TERM GOAL #1   Title Independent with HEP/gym program (04/08/15)   Time 6   Period Weeks   Status New   PT LONG TERM GOAL #2   Title Patient will  report no sleep disturbance due to low back pain (04/08/15)   Time 6   Period Weeks   Status New   PT LONG TERM GOAL #3   Title Patient will demonstrate good awareness of proper sitting posture with neutral spine to improve sitting tolerance at  work (04/08/15)   Time 6   Period Weeks   Status New   PT LONG TERM GOAL #4   Title Patient able to perform all ADLs, chores, and recreational activities without restriction by back pain (04/08/15)   Time 6   Period Weeks   Status New               Plan - 02/25/15 1856    Clinical Impression Statement Patient is a 49 y/o female who presents to OP PT with insidious onset of low back pain greater than one year ago with symptoms worsening over the past six months. Pain most prominent in left low back/buttock with intermittent radicular pain into left leg to just above knee and occasional numbess and tingling down to toes. Pain interferes with sleeping and requries frequent change of position when sitting. Lumbar ROM essentially WFL with slight restriction in extension ROM but no pain with any movements. LE flexibility restricted left > right, most noteably in ITB, piriformis, hamstrings and hip flexors, with weakness also present in left hip grossly 3+/5. Patient reports some relief from initial exercises provided by MD but required some clarification for proper technique.   Pt will benefit from skilled therapeutic intervention in order to improve on the following deficits Pain;Impaired flexibility;Decreased strength;Decreased range of motion;Postural dysfunction   Rehab Potential Good   PT Frequency --  1-2x/wk   PT Duration 6 weeks   PT Treatment/Interventions Therapeutic exercise;Manual techniques;Therapeutic activities;Passive range of motion;Taping;Traction;Ultrasound;Electrical Stimulation;Moist Heat;Cryotherapy;Iontophoresis 4mg /ml Dexamethasone;Patient/family education   PT Next Visit Plan Assess for possible SIJ involvement; Review HEP;  Lumbar/LE flexibility/strengthening; ?Traction; Manual therapy and modalities PRN   Consulted and Agree with Plan of Care Patient         Problem List Patient Active Problem List   Diagnosis Date Noted  . Left lumbar radiculopathy 01/30/2015  . Postinflammatory hyperpigmentation 09/10/2014  . Leukopenia 08/14/2014  . Annual physical exam 08/13/2014  . Neck pain 08/13/2014  . Right wrist pain 08/13/2014  . Seborrheic dermatitis of scalp 08/13/2014  . Endometriosis 08/19/2011    Marry GuanJoAnne M Dorothy Polhemus, PT, MPT 02/25/2015, 7:31 PM  [facility] [place] [address]  [address] [place], [place], [address] Phone: [phone]   Fax:  [phone]  Name: Stacy Young MRN: [id number] Date of Birth: 08/18/1965

## 2015-02-27 ENCOUNTER — Ambulatory Visit: Payer: 59 | Admitting: Sports Medicine

## 2015-03-07 ENCOUNTER — Ambulatory Visit: Payer: 59 | Admitting: Physical Therapy

## 2015-03-26 ENCOUNTER — Ambulatory Visit: Payer: 59 | Attending: Sports Medicine | Admitting: Physical Therapy

## 2015-03-26 DIAGNOSIS — R29898 Other symptoms and signs involving the musculoskeletal system: Secondary | ICD-10-CM | POA: Insufficient documentation

## 2015-03-26 DIAGNOSIS — M5442 Lumbago with sciatica, left side: Secondary | ICD-10-CM | POA: Diagnosis not present

## 2015-03-26 NOTE — Therapy (Signed)
[facility] [address]  [address] [place], [place], [address] Phone: [phone]   Fax:  [phone]  Physical Therapy Treatment  Patient Details  Name: Stacy Young MRN: [id number] Date of Birth: 02/13/1966 Referring Provider: Rodney Langton, MD  Encounter Date: 03/26/2015      PT End of Session - 03/26/15 0850    Visit Number 2   Number of Visits 12   Date for PT Re-Evaluation 04/08/15   PT Start Time 0846   PT Stop Time 0930   PT Time Calculation (min) 44 min   Activity Tolerance Patient tolerated treatment well   Behavior During Therapy [facility] for tasks assessed/performed      Past Medical History  Diagnosis Date  . H/O varicella   . Endometriosis 01/2002  . Vaginal dryness 2002  . BV (bacterial vaginosis) 2003  . Hx of abnormal menstrual cycle 10/2001  . Dermoid cyst 10/2001    left  . H/O dysmenorrhea 08/07/2003  . H/O fatigue 08/2003  . PMS (premenstrual syndrome) 10/2004  . Hx: UTI (urinary tract infection) 04/2006  . Monilia infection 06/2006  . Irregular periods/menstrual cycles 09/2007  . Hx of constipation 06/2010  . H/O pelvic mass 06/2010    Past Surgical History  Procedure Laterality Date  . Wisdom tooth extraction  10/1998  . Cystectomy  01/10/2007  . Dilation and curettage of uterus  2000    There were no vitals filed for this visit.  Visit Diagnosis:  Left-sided low back pain with left-sided sciatica  Left leg weakness      Subjective Assessment - 03/26/15 0848    Subjective Patient reports no pain if she keeps up with her exercises.    Currently in Pain? No/denies           Today's Treatment  TherEx Rec Bike - lvl 2 x 5' HEP review:   Standing ITB stretch 3x20" Abd bracing 10x5" Bridge + TrA 10x3" Hooklying TrA Marching x10 Dead bug x10 LTR with feet on orange (55cm) Pball x10 3 way prayer stretch 3x20" Quadruped:   Cat/Camel x10   Alternating hip extension x10   Alt  Arm/Leg lift ("Bird dog") x10  TRX squat x10 BATCA    Low Row 20# x10 focusing on abdominal control   Alt Row 15# x10       PT Education - 03/26/15 0947    Education provided Yes   Education Details Updated HEP, proper technique for squat   Person(s) Educated Patient   Methods Explanation;Demonstration;Handout   Comprehension Verbalized understanding;Returned demonstration             PT Long Term Goals - 03/26/15 0937    PT LONG TERM GOAL #1   Title Independent with HEP/gym program (04/08/15)   Status On-going   PT LONG TERM GOAL #2   Title Patient will report no sleep disturbance due to low back pain (04/08/15)   Status Achieved   PT LONG TERM GOAL #3   Title Patient will demonstrate good awareness of proper sitting posture with neutral spine to improve sitting tolerance at work (04/08/15)   Status On-going   PT LONG TERM GOAL #4   Title Patient able to perform all ADLs, chores, and recreational activities without restriction by back pain (04/08/15)   Status On-going               Plan - 03/26/15 0937    Clinical Impression Statement Patient returning for first  therapy visit since eval 1 month ago. Reporting good relief of pain with HEP stretches but has questions with lumbar stabilization exercises. Progressed lumbar stabilization and updated HEP provided with good patient tolerance observed. Instructed patient in proper form/technique for squat to protect her back.   PT Next Visit Plan Review HEP; Lumbar/LE flexibility/strengthening; Manual therapy and modalities PRN   Consulted and Agree with Plan of Care Patient        Problem List Patient Active Problem List   Diagnosis Date Noted  . Left lumbar radiculopathy 01/30/2015  . Postinflammatory hyperpigmentation 09/10/2014  . Leukopenia 08/14/2014  . Annual physical exam 08/13/2014  . Neck pain 08/13/2014  . Right wrist pain 08/13/2014  . Seborrheic dermatitis of scalp 08/13/2014  . Endometriosis  08/19/2011    Marry GuanJoAnne M Kreis, PT, MPT  03/26/2015, 11:26 AM  [facility] [place] [address]  [address] [place], [place], [address] Phone: [phone]   Fax:  [phone]  Name: Stacy Young MRN: [id number] Date of Birth: 11/05/1965

## 2015-03-31 ENCOUNTER — Ambulatory Visit: Payer: 59 | Admitting: Physical Therapy

## 2015-04-03 ENCOUNTER — Telehealth: Payer: Self-pay

## 2015-04-03 ENCOUNTER — Encounter: Payer: Self-pay | Admitting: Sports Medicine

## 2015-04-03 ENCOUNTER — Ambulatory Visit (INDEPENDENT_AMBULATORY_CARE_PROVIDER_SITE_OTHER): Payer: 59 | Admitting: Sports Medicine

## 2015-04-03 VITALS — BP 146/92 | HR 72 | Temp 98.1°F | Resp 16 | Wt 141.7 lb

## 2015-04-03 DIAGNOSIS — R5383 Other fatigue: Secondary | ICD-10-CM | POA: Diagnosis not present

## 2015-04-03 DIAGNOSIS — N809 Endometriosis, unspecified: Secondary | ICD-10-CM | POA: Diagnosis not present

## 2015-04-03 DIAGNOSIS — N951 Menopausal and female climacteric states: Secondary | ICD-10-CM | POA: Insufficient documentation

## 2015-04-03 LAB — COMPREHENSIVE METABOLIC PANEL WITH GFR
ALT: 15 U/L (ref 6–29)
Alkaline Phosphatase: 53 U/L (ref 33–115)
BUN: 14 mg/dL (ref 7–25)
Creat: 0.76 mg/dL (ref 0.50–1.10)
Potassium: 4 mmol/L (ref 3.5–5.3)
Sodium: 138 mmol/L (ref 135–146)
Total Protein: 7.1 g/dL (ref 6.1–8.1)

## 2015-04-03 LAB — TSH: TSH: 1.487 u[IU]/mL (ref 0.350–4.500)

## 2015-04-03 LAB — COMPREHENSIVE METABOLIC PANEL
AST: 24 U/L (ref 10–35)
Albumin: 4.1 g/dL (ref 3.6–5.1)
CO2: 27 mmol/L (ref 20–31)
Calcium: 9.3 mg/dL (ref 8.6–10.2)
Chloride: 104 mmol/L (ref 98–110)
Glucose, Bld: 78 mg/dL (ref 65–99)
Total Bilirubin: 0.3 mg/dL (ref 0.2–1.2)

## 2015-04-03 LAB — IRON AND TIBC
%SAT: 23 % (ref 11–50)
Iron: 67 ug/dL (ref 40–190)
TIBC: 296 ug/dL (ref 250–450)
UIBC: 229 ug/dL (ref 125–400)

## 2015-04-03 LAB — CBC
HCT: 38.6 % (ref 36.0–46.0)
Hemoglobin: 12.4 g/dL (ref 12.0–15.0)
MCH: 28.5 pg (ref 26.0–34.0)
MCHC: 32.1 g/dL (ref 30.0–36.0)
MCV: 88.7 fL (ref 78.0–100.0)
MPV: 10.5 fL (ref 8.6–12.4)
Platelets: 277 10*3/uL (ref 150–400)
RBC: 4.35 MIL/uL (ref 3.87–5.11)
RDW: 13.9 % (ref 11.5–15.5)
WBC: 3.5 10*3/uL — ABNORMAL LOW (ref 4.0–10.5)

## 2015-04-03 LAB — FOLATE: Folate: >20 ng/mL

## 2015-04-03 LAB — CK: Total CK: 150 U/L (ref 7–177)

## 2015-04-03 LAB — VITAMIN B12: Vitamin B-12: 572 pg/mL (ref 211–911)

## 2015-04-03 LAB — FERRITIN: Ferritin: 96 ng/mL (ref 10–291)

## 2015-04-03 NOTE — Assessment & Plan Note (Signed)
Unclear etiology, seems to be sleeping well and no symptoms of depression. We are going to do a bit of a shotgun workup, she has also increased her workout regimen so we are going to add a CK level. Also going to check her hormones to evaluate her menopausal state.

## 2015-04-03 NOTE — Assessment & Plan Note (Signed)
Spotting between periods and after sex, referral to OB/GYN.

## 2015-04-03 NOTE — Progress Notes (Signed)
  Subjective:    CC: "I'm tired"  HPI: Patient presents with a ~1 month history of feeling fatigued. Her sleep habits involve her getting ~6 hours of sleep (10:30-4:30ish), waking up for ~1 hour, and then going back to sleep for another hour before waking up to go to work. She reports feeling well rested when she does wake up. She says that this has been her sleep habit for many years and nothing has really changed recently. She denies any changes to caffeine use and reports drinking 1 cup of tea or coffee a day, at most. She says that she has changed her eating habits and is eating more "junk food" but does not report eating less than usual. Patient does endorse feeling "cold all the time" but denies any constipation, weight gain, change in voice, or decreased ability to concentrate.  She reports having some changes in her menstrual cycle. She currently has a Mirena IUD in place and since placement has had intracycle spotting for many years. However, over the past couple months, the patient reports increased frequency of spotting and more days of bleeding. She denies any heavy bleeding, just increased days of light bleeding. She is unsure when her last pap was but thinks it was in the past year and was normal. She does have post coital bleeding and occasionally has night sweats. She also feels more "moody" lately and endorses getting into arguments with her family members. She denies any depression or anxiety.  Patient also endorses urinating more frequently and drinking more water than usual. She has also had some blurry vision in the past and has been seeing an optometrist for this. She denies a family history of DM and reports previous tests have been normal.  Patient denies bloody stools, fevers, chills, snoring, sore throat, chest pain, SOB, and abdominal pain.  Past medical history, Surgical history, Family history not pertinant except as noted below, Social history, Allergies, and medications  have been entered into the medical record, reviewed, and no changes needed.   Objective:    General: Well Developed, well nourished, and in no acute distress.  Neuro: Alert and oriented x3, extra-ocular muscles intact, sensation grossly intact.  HEENT: Normocephalic, atraumatic, pupils equal round reactive to light, neck supple, no masses, no lymphadenopathy, thyroid nonpalpable, no pallor or jaundice noted.  Skin: Warm and dry, no rashes, no pallor, no jaundice. Cardiac: Regular rate and rhythm, no murmurs rubs or gallops, no lower extremity edema.  Respiratory: Clear to auscultation bilaterally. Not using accessory muscles, speaking in full sentences. Abdomen: NT/ND. Normal bowel sounds. No hepatosplenomegaly.  Impression and Recommendations:    Patient's fatigue could be due to a multitude of things. Will get a TSH to r/o hypothyroidism. A CBC to r/o anemia. Glucose testing to r/o DM. LH, FSH, estrogen, and progesterone to r/o menopause. Instructions to F/U with her Gyn physician to evaluate need for testing for gyn malignancy given change in periods, post coital bleeding, and night sweets. Will also get an ANA to evaluate for possible underlying autoimmune etiology. Patient instructed that she will receive a call about the results.

## 2015-04-04 LAB — HEMOGLOBIN A1C
Hgb A1c MFr Bld: 5.5 % (ref <5.7)
Mean Plasma Glucose: 111 mg/dL (ref <117)

## 2015-04-04 LAB — PROGESTERONE: Progesterone: <0.2 ng/mL

## 2015-04-04 LAB — ANA: Anti Nuclear Antibody(ANA): NEGATIVE

## 2015-04-04 LAB — FOLLICLE STIMULATING HORMONE: FSH: 10.3 m[IU]/mL

## 2015-04-04 LAB — LUTEINIZING HORMONE: LH: 4.4 m[IU]/mL

## 2015-04-07 ENCOUNTER — Ambulatory Visit: Payer: 59 | Admitting: Physical Therapy

## 2015-04-07 DIAGNOSIS — M5442 Lumbago with sciatica, left side: Secondary | ICD-10-CM

## 2015-04-07 DIAGNOSIS — R29898 Other symptoms and signs involving the musculoskeletal system: Secondary | ICD-10-CM

## 2015-04-07 NOTE — Patient Instructions (Addendum)
Strengthening: Hip Abduction - Resisted    With tubing around right leg, other side toward anchor, extend leg out from side. Repeat to other side.  Repeat __10__ times per set. Do __1__ sets per session. Do __2__ sessions per day.  http://orth.exer.us/635   Copyright  VHI. All rights reserved.    Strengthening: Hip Extension - Resisted    With tubing around right ankle, face anchor and pull leg straight back. Repeat to other side. Repeat __10__ times per set. Do ___1_ sets per session. Do __2_ sessions per day.  http://orth.exer.us/637   Copyright  VHI. All rights reserved.    Strengthening: Hip Adduction - Resisted    With tubing around left leg, bring leg across body. Repeat to other side. Repeat __10__ times per set. Do __1__ sets per session. Do __2__ sessions per day.  http://orth.exer.us/633   Copyright  VHI. All rights reserved.    Strengthening: Hip Flexion - Resisted    With tubing around left ankle, anchor behind, bring leg forward, keeping knee straight. Repeat to other side. Repeat _10___ times per set. Do __1__ sets per session. Do __2__ sessions per day.  http://orth.exer.us/639   Copyright  VHI. All rights reserved.

## 2015-04-07 NOTE — Therapy (Signed)
[facility] [place] [address]  [place], [place], [address] Phone: [phone]   Fax:  [phone]  Physical Therapy Treatment/Discharge  Patient Details  Name: Stacy Young MRN: [id number] Date of Birth: 1966-03-13 Referring Provider: Aundria Mems, MD  Encounter Date: 04/07/2015      PT End of Session - 04/07/15 0816    Visit Number 3   Number of Visits 12   Date for PT Re-Evaluation 04/08/15   PT Start Time 0716   PT Stop Time 0756   PT Time Calculation (min) 40 min   Activity Tolerance Patient tolerated treatment well   Behavior During Therapy [facility] for tasks assessed/performed      Past Medical History  Diagnosis Date  . H/O varicella   . Endometriosis 01/2002  . Vaginal dryness 2002  . BV (bacterial vaginosis) 2003  . Hx of abnormal menstrual cycle 10/2001  . Dermoid cyst 10/2001    left  . H/O dysmenorrhea 08/07/2003  . H/O fatigue 08/2003  . PMS (premenstrual syndrome) 10/2004  . Hx: UTI (urinary tract infection) 04/2006  . Monilia infection 06/2006  . Irregular periods/menstrual cycles 09/2007  . Hx of constipation 06/2010  . H/O pelvic mass 06/2010    Past Surgical History  Procedure Laterality Date  . Wisdom tooth extraction  10/1998  . Cystectomy  01/10/2007  . Dilation and curettage of uterus  2000    There were no vitals filed for this visit.  Visit Diagnosis:  Left-sided low back pain with left-sided sciatica  Left leg weakness      Subjective Assessment - 04/07/15 0718    Subjective Pt reports no pain and that the last set of exercises were harder and caused a little pain but went away with stretches. Pt feels like back pain has resolved with continued HEP. Pt reports ability to complete ADL's, chores, and recreational activiities without any aggrevation of back pain. Pt also reported ability to sit at work with minimal pain which resolves with stretching and standing intermittently.     Currently in Pain? No/denies            [facility] PT Assessment - 04/07/15 0803    Observation/Other Assessments   Focus on Therapeutic Outcomes (FOTO)  98% (2% limited)                     [facility] Adult PT Treatment/Exercise - 04/07/15 0724    Exercises   Exercises Lumbar;Knee/Hip   Lumbar Exercises: Supine   Ab Set 5 reps;5 seconds   AB Set Limitations HEP review   Dead Bug 10 reps;5 seconds   Dead Bug Limitations cuing to slow and hold; HEP review   Other Supine Lumbar Exercises Lower trunk rotation 90/90; 10x 5 sec hold   Other Supine Lumbar Exercises Brace supine marching 5 reps; HEP review   Lumbar Exercises: Quadruped   Opposite Arm/Leg Raise Right arm/Left leg;Left arm/Right leg;10 reps;5 seconds   Opposite Arm/Leg Raise Limitations HEP review with spine alignment and control of leaning cuing   Knee/Hip Exercises: Aerobic   Recumbent Bike L2 x 7 min   Knee/Hip Exercises: Standing   Hip Flexion Stengthening;1 set;10 reps;Knee straight;Left   Hip Flexion Limitations Red therabanc   Hip ADduction Strengthening;1 set;10 reps;Left   Hip ADduction Limitations Red theraband   Hip Abduction Stengthening;1 set;10 reps;Knee straight;Left   Abduction Limitations Red theraband   Hip Extension Stengthening;1 set;10 reps;Knee straight;Left   Extension Limitations  Red theraband   Knee/Hip Exercises: Supine   Bridges Strengthening;Both;1 set;10 reps   Bridges Limitations isometric ABD with belt, HEP review/progression                PT Education - 04/07/15 0815    Education provided Yes   Education Details Standing/sitting posture; 4 way hip strengthening exercise, HEP review with some corrections   Person(s) Educated Patient   Methods Explanation;Demonstration;Handout   Comprehension Verbalized understanding;Returned demonstration             PT Long Term Goals - 04/07/15 1448    PT LONG TERM GOAL #1   Title Independent with HEP/gym program (04/08/15)    Status Achieved   PT LONG TERM GOAL #2   Title Patient will report no sleep disturbance due to low back pain (04/08/15)   Status Achieved   PT LONG TERM GOAL #3   Title Patient will demonstrate good awareness of proper sitting posture with neutral spine to improve sitting tolerance at work (04/08/15)   Status Achieved   PT LONG TERM GOAL #4   Title Patient able to perform all ADLs, chores, and recreational activities without restriction by back pain (04/08/15)   Status Achieved               Plan - 04/07/15 0817    Clinical Impression Statement Patient reported no pain and continued relief of pain with HEP. Patient has met all goals and is confident that with the exercises, she no longer needs PT for improvement. Therapist agreed after reviewing and revising HEP PRN with pt. Pt to be discharged with FOTO improvement from 63% (37% limited) to 98% (2% limited).    PT Next Visit Plan DC PT today   Consulted and Agree with Plan of Care Patient        Problem List Patient Active Problem List   Diagnosis Date Noted  . Fatigue 04/03/2015  . Left lumbar radiculopathy 01/30/2015  . Leukopenia 08/14/2014  . Annual physical exam 08/13/2014  . Neck pain 08/13/2014  . Right wrist pain 08/13/2014  . Seborrheic dermatitis of scalp 08/13/2014  . Endometriosis 08/19/2011   Read, reviewed, edited and agree with student's findings and recommendations.   Laureen Abrahams, PT, DPT 04/07/2015 8:33 AM    Ammie Ferrier, SPT 04/07/2015  8:29 AM   Laureen Abrahams, PT, DPT 04/07/2015 8:33 AM   [facility] [address]  [place], [place], [address] Phone: [phone]   Fax:  [phone]  Name: Stacy Young MRN: [id number] Date of Birth: 1966-03-05    PHYSICAL THERAPY DISCHARGE SUMMARY  Visits from Start of Care: 3  Current functional level related to goals / functional outcomes: See above; all goals  met   Remaining deficits: n/a   Education / Equipment: Posture; HEP  Plan: Patient agrees to discharge.  Patient goals were met. Patient is being discharged due to meeting the stated rehab goals.  ?????    Laureen Abrahams, PT, DPT 04/07/2015 8:34 AM

## 2015-04-08 LAB — ESTROGENS, TOTAL: Estrogen: 143.4 pg/mL

## 2015-04-18 ENCOUNTER — Ambulatory Visit (INDEPENDENT_AMBULATORY_CARE_PROVIDER_SITE_OTHER): Payer: 59 | Admitting: Sports Medicine

## 2015-04-18 ENCOUNTER — Encounter: Payer: Self-pay | Admitting: Sports Medicine

## 2015-04-18 VITALS — BP 134/65 | HR 72 | Resp 18 | Wt 139.5 lb

## 2015-04-18 DIAGNOSIS — N951 Menopausal and female climacteric states: Secondary | ICD-10-CM | POA: Diagnosis not present

## 2015-04-18 DIAGNOSIS — Z23 Encounter for immunization: Secondary | ICD-10-CM

## 2015-04-18 DIAGNOSIS — Z0000 Encounter for general adult medical examination without abnormal findings: Secondary | ICD-10-CM

## 2015-04-18 NOTE — Assessment & Plan Note (Signed)
Declines influenza vaccine, Tdap Given today.

## 2015-04-18 NOTE — Assessment & Plan Note (Signed)
Large serum workup was negative with the exception of relatively low estrogen levels, FSH and LH were still in the normal range. This does suggest that she is perimenopausal, and that her fatigue is simply physiologic. No signs of depression. Return as needed, she does have the option to discuss hormone replacement with her OB/GYN.

## 2015-04-18 NOTE — Progress Notes (Signed)
  Subjective:    CC: follow-up  HPI: This is a pleasant 50 year old female, we have been working her up for nonspecific fatigue, a large panel of blood work was recently negative, with the exception of relatively low estrogen levels but normal FSH and LH levels. She understands this, and tells me that the fatigue is minimal and that she could live with it, she is agreeable to the diagnosis of perimenopausal syndrome.  Past medical history, Surgical history, Family history not pertinant except as noted below, Social history, Allergies, and medications have been entered into the medical record, reviewed, and no changes needed.   Review of Systems: No fevers, chills, night sweats, weight loss, chest pain, or shortness of breath.   Objective:    General: Well Developed, well nourished, and in no acute distress.  Neuro: Alert and oriented x3, extra-ocular muscles intact, sensation grossly intact.  HEENT: Normocephalic, atraumatic, pupils equal round reactive to light, neck supple, no masses, no lymphadenopathy, thyroid nonpalpable.  Skin: Warm and dry, no rashes. Cardiac: Regular rate and rhythm, no murmurs rubs or gallops, no lower extremity edema.  Respiratory: Clear to auscultation bilaterally. Not using accessory muscles, speaking in full sentences.  Impression and Recommendations:

## 2015-07-24 ENCOUNTER — Encounter: Payer: Self-pay | Admitting: Emergency Medicine

## 2015-07-24 ENCOUNTER — Emergency Department
Admission: EM | Admit: 2015-07-24 | Discharge: 2015-07-24 | Disposition: A | Discharge status: Home / Self Care | Payer: 59 | Source: Home / Self Care | Attending: Family Medicine | Admitting: Family Medicine

## 2015-07-24 DIAGNOSIS — B309 Viral conjunctivitis, unspecified: Secondary | ICD-10-CM | POA: Diagnosis not present

## 2015-07-24 MED ORDER — KETOROLAC TROMETHAMINE 0.5 % OP SOLN: 1.0000 [drp] | Freq: Four times a day (QID) | OPHTHALMIC | Status: DC

## 2015-07-24 NOTE — Discharge Instructions (Signed)
May use eye drops in both eyes if necessary.  If cold symptoms develop, try the following: Take plain guaifenesin (  extended release tabs such as Mucinex) twice daily, with plenty of water, for cough and congestion.  May add Pseudoephedrine ( , one or two every 4 to 6 hours) for sinus congestion.  Get adequate rest.   May use Afrin nasal spray (or generic oxymetazoline) twice daily for about 5 days and then discontinue.  Also recommend using saline nasal spray several times daily and saline nasal irrigation (AYR is a common brand).  Use Flonase nasal spray each morning after using Afrin nasal spray and saline nasal irrigation. Try warm salt water gargles for sore throat.  Stop all antihistamines for now, and other non-prescription cough/cold preparations. May take Ibuprofen , 3 or 4 tabs every 8 hours with food for sore throat, body aches, etc.   Viral Conjunctivitis Viral conjunctivitis is an inflammation of the clear membrane that covers the white part of your eye and the inner surface of your eyelid (conjunctiva). The inflammation is caused by a viral infection. The blood vessels in the conjunctiva become inflamed, causing the eye to become red or pink, and often itchy. Viral conjunctivitis can easily be passed from one person to another (contagious). CAUSES  Viral conjunctivitis is caused by a virus. A virus is a type of contagious germ. It can be spread by touching objects that have been contaminated with the virus, such as doorknobs or towels.  SYMPTOMS  Symptoms of viral conjunctivitis may include:   Eye redness.  Tearing or watery eyes.  Itchy eyes.  Burning feeling in the eyes.  Clear drainage from the eye.  Swollen eyelids.  A gritty feeling in the eye.  Light sensitivity. DIAGNOSIS  Viral conjunctivitis may be diagnosed with a medical history and physical exam. If you have discharge from your eye, the discharge may be tested to rule out other causes of  conjunctivitis.  TREATMENT  Viral conjunctivitis does not respond to medicines that kill bacteria (antibiotics). Treatment for viral conjunctivitis is directed at stopping a bacterial infection from developing in addition to the viral infection. Treatment also aims to relieve your symptoms, such as itching. This may be done with antihistamine drops or other eye medicines. HOME CARE INSTRUCTIONS  Take medicines only as directed by your health care provider.  Avoid touching or rubbing your eyes.  Apply a warm, clean washcloth to your eye for 10-20 minutes, 3-4 times per day.  If you wear contact lenses, do not wear them until the inflammation is gone and your health care provider says it is safe to wear them again. Ask your health care provider how to sterilize or replace your contact lenses before using them again. Wear glasses until you can resume wearing contacts.  Avoid wearing eye makeup until the inflammation is gone. Throw away any old eye cosmetics that may be contaminated.  Change or wash your pillowcase every day.  Do not share towels or washcloths. This may spread the infection.  Wash your hands often with soap and water. Use paper towels to dry your hands.  Gently wipe away any drainage from your eye with a warm, wet washcloth or a cotton ball.  Be very careful to avoid touching the edge of the eyelid with the eye drop bottle or ointment tube when applying medicines to the affected eye. This will stop you from spreading the infection to the other eye or to other people. SEEK MEDICAL CARE IF:  Your symptoms do not improve with treatment.  You have increased pain.  Your vision becomes blurry.  You have a fever.  You have facial pain, redness, or swelling.  You have new symptoms.  Your symptoms get worse.   This information is not intended to replace advice given to you by your health care provider. Make sure you discuss any questions you have with your health care  provider.   Document Released: 05/29/2002 Document Revised: 08/30/2005 Document Reviewed: 12/18/2013 Elsevier Interactive Patient Education [employer] Elsevier Inc.

## 2015-07-24 NOTE — ED Provider Notes (Signed)
CSN: [id number]     Arrival date & time 07/24/15  1753 History   First MD Initiated Contact with Patient 07/24/15 1845     Chief Complaint  Patient presents with  . Eye Problem      HPI Comments: Patient awoke with mild redness, itching, and "twitching" of her left eye today.  No foreign body sensation.  She notes that her right eye is now beginning to twitch slightly.   She feels well otherwise.  The history is provided by the patient.    Past Medical History  Diagnosis Date  . H/O varicella   . Endometriosis 01/2002  . Vaginal dryness 2002  . BV (bacterial vaginosis) 2003  . Hx of abnormal menstrual cycle 10/2001  . Dermoid cyst 10/2001    left  . H/O dysmenorrhea 08/07/2003  . H/O fatigue 08/2003  . PMS (premenstrual syndrome) 10/2004  . Hx: UTI (urinary tract infection) 04/2006  . Monilia infection 06/2006  . Irregular periods/menstrual cycles 09/2007  . Hx of constipation 06/2010  . H/O pelvic mass 06/2010   Past Surgical History  Procedure Laterality Date  . Wisdom tooth extraction  10/1998  . Cystectomy  01/10/2007  . Dilation and curettage of uterus  2000   Family History  Problem Relation Age of Onset  . Heart disease Father     MI  . Hypertension Father   . Stroke Father   . Hypertension Mother   . Breast cancer Other    Social History  Substance Use Topics  . Smoking status: Never Smoker   . Smokeless tobacco: Never Used  . Alcohol Use: No   OB History    Gravida Para Term Preterm AB TAB SAB Ectopic Multiple Living   2 1 1  1  1   1      Review of Systems No sore throat No cough No pleuritic pain No wheezing ? nasal congestion No post-nasal drainage No sinus pain/pressure + itchy/red eyes No earache No hemoptysis No SOB No fever/chills No nausea No vomiting No abdominal pain No diarrhea No urinary symptoms No skin rash No fatigue No myalgias No headache    Allergies  Darvocet; Latex; and Naproxen  Home Medications   Prior to Admission  medications   Medication Sig Start Date End Date Taking? Authorizing Provider  cholecalciferol (VITAMIN D) 1000 units tablet Take 5,000 Units by mouth daily.    Historical Provider, MD  ketorolac (ACULAR) 0.5 % ophthalmic solution Place 1 drop into the left eye 4 (four) times daily. 07/24/15   Lattie HawStephen A Beese, MD  Probiotic Product (PROBIOTIC ACIDOPHILUS PO) Take by mouth.    Historical Provider, MD   Meds Ordered and Administered this Visit  Medications - No data to display  BP 147/85 mmHg  Pulse 66  Temp(Src) 98.5 F (36.9 C) (Oral)  Ht 5\' 7"  (1.702 m)  Wt 135 lb (61.236 kg)  BMI 21.14 kg/m2  SpO2 99%  LMP 07/24/2015 No data found.   Physical Exam Nursing notes and Vital Signs reviewed. Appearance:  Patient appears stated age, and in no acute distress Eyes:  Pupils are equal, round, and reactive to light and accomodation.  Extraocular movement is intact.  Left conjunctivae slightly injected.  Right conjunctivae normal.  No photophobia.  No foreign bodies noted. Ears:  Canals normal.  Tympanic membranes normal.  Nose:  Mildly congested turbinates.  No sinus tenderness.   Pharynx:  Normal Neck:  Supple.  Tender enlarged posterior/lateral nodes are palpated bilaterally  Lungs:  Clear to auscultation.  Breath sounds are equal.  Moving air well. Heart:  Regular rate and rhythm without murmurs, rubs, or gallops.  Abdomen:  Nontender without masses or hepatosplenomegaly.  Bowel sounds are present.  No CVA or flank tenderness.  Extremities:  No edema.  Skin:  No rash present.   ED Course  Procedures none   Visual Acuity Review  Right Eye Distance: 20/40 Left Eye Distance: 20/40 Bilateral Distance: 20/30 (with correction)    MDM   1. Acute viral conjunctivitis of left eye; suspect early viral URI    Rx for Acular ophthalmic suspension. May use eye drops in both eyes if necessary.  If cold symptoms develop, try the following: Take plain guaifenesin (  extended release  tabs such as Mucinex) twice daily, with plenty of water, for cough and congestion.  May add Pseudoephedrine ( , one or two every 4 to 6 hours) for sinus congestion.  Get adequate rest.   May use Afrin nasal spray (or generic oxymetazoline) twice daily for about 5 days and then discontinue.  Also recommend using saline nasal spray several times daily and saline nasal irrigation (AYR is a common brand).  Use Flonase nasal spray each morning after using Afrin nasal spray and saline nasal irrigation. Try warm salt water gargles for sore throat.  Stop all antihistamines for now, and other non-prescription cough/cold preparations. May take Ibuprofen , 3 or 4 tabs every 8 hours with food for sore throat, body aches, etc.  Followup with ophthalmologist if not improving about 4 days.    Lattie Haw, MD 07/30/15 [phone]

## 2015-07-24 NOTE — ED Notes (Signed)
Left eye twitching, itching, red, hurts a little started this morning, right eye is starting to itch.

## 2015-07-25 ENCOUNTER — Ambulatory Visit: Payer: 59 | Admitting: Sports Medicine

## 2016-03-04 ENCOUNTER — Other Ambulatory Visit: Payer: Self-pay

## 2016-03-04 ENCOUNTER — Ambulatory Visit (INDEPENDENT_AMBULATORY_CARE_PROVIDER_SITE_OTHER): Payer: 59 | Admitting: Sports Medicine

## 2016-03-04 ENCOUNTER — Encounter: Payer: Self-pay | Admitting: Sports Medicine

## 2016-03-04 DIAGNOSIS — H1010 Acute atopic conjunctivitis, unspecified eye: Secondary | ICD-10-CM | POA: Insufficient documentation

## 2016-03-04 DIAGNOSIS — H1013 Acute atopic conjunctivitis, bilateral: Secondary | ICD-10-CM | POA: Diagnosis not present

## 2016-03-04 MED ORDER — AZELASTINE HCL 0.05 % OP SOLN: 2.0000 [drp] | Freq: Two times a day (BID) | OPHTHALMIC | 11 refills | Status: DC

## 2016-03-04 MED ORDER — FEXOFENADINE HCL 180 MG PO TABS: 180.0000 mg | ORAL_TABLET | Freq: Every day | ORAL | 3 refills | Status: DC

## 2016-03-04 MED ORDER — FLUTICASONE PROPIONATE 50 MCG/ACT NA SUSP: NASAL | 3 refills | Status: DC

## 2016-03-04 NOTE — Patient Instructions (Signed)
Allergic Conjunctivitis, Adult Allergic conjunctivitis is inflammation of the clear membrane that covers the white part of your eye and the inner surface of your eyelid (conjunctiva). The inflammation is caused by allergies. The blood vessels in the conjunctiva become inflamed and this causes the eyes to become red or pink. The eyes often feel itchy. Allergic conjunctivitis cannot be spread from one person to another person (is not contagious). What are the causes? This condition is caused by an allergic reaction. Common causes of an allergic reaction (allergens) include:  Outdoor allergens, such as:  Pollen.  Grass and weeds.  Mold spores.  Indoor allergens, such as:  Dust.  Smoke.  Mold.  Pet dander.  Animal hair. What increases the risk? You may be more likely to develop this condition if you have a family history of allergies, such as:  Allergic rhinitis.  Bronchial asthma.  Atopic dermatitis. What are the signs or symptoms? Symptoms of this condition include eyes that are:  Itchy.  Red.  Watery.  Puffy. Your eyes may also:  Sting or burn.  Have clear drainage coming from them. How is this diagnosed? This condition may be diagnosed by medical history and physical exam. If you have drainage from your eyes, it may be tested to rule out other causes of conjunctivitis. You may also need to see a health care provider who specializes in treating allergies (allergist) or eye conditions (ophthalmologist) for tests to confirm the diagnosis. You may have:  Skin tests to see which allergens are causing your symptoms. These tests involve pricking the skin with a tiny needle and exposing the skin to small amounts of potential allergens to see if your skin reacts.  Blood tests.  Tissue scrapings from your eyelid. These will be examined under a microscope. How is this treated? Treatments for this condition may include:  Cold cloths (compresses) to soothe itching and  swelling.  Washing the face to remove allergens.  Eye drops. These may be prescription or over-the-counter. There are several different types. You may need to try different types to see which one works best for you. Your may need:  Eye drops that block the allergic reaction (antihistamine).  Eye drops that reduce swelling and irritation (anti-inflammatory).  Steroid eye drops to lessen a severe reaction (vernal conjunctivitis).  Oral antihistamine medicines to reduce your allergic reaction. You may need these if eye drops do not help or are difficult to use. Follow these instructions at home:  Avoid known allergens whenever possible.  Take or apply over-the-counter and prescription medicines only as told by your health care provider. These include any eye drops.  Apply a cool, clean washcloth to your eye for 10-20 minutes, 3-4 times a day.  Do not touch or rub your eyes.  Do not wear contact lenses until the inflammation is gone. Wear glasses instead.  Do not wear eye makeup until the inflammation is gone.  Keep all follow-up visits as told by your health care provider. This is important. Contact a health care provider if:  Your symptoms get worse or do not improve with treatment.  You have mild eye pain.  You have sensitivity to light.  You have spots or blisters on your eyes.  You have pus draining from your eye.  You have a fever. Get help right away if:  You have redness, swelling, or other symptoms in only one eye.  Your vision is blurred or you have vision changes.  You have severe eye pain. This information is   not intended to replace advice given to you by your health care provider. Make sure you discuss any questions you have with your health care provider. Document Released: 05/29/2002 Document Revised: 11/05/2015 Document Reviewed: 09/19/2015 Elsevier Interactive Patient Education  2017 Elsevier Inc.  

## 2016-03-04 NOTE — Assessment & Plan Note (Signed)
We will treat this aggressively, fexofenadine, Optivar, Flonase. Return to see me in one month for a physical and to follow up allergies.

## 2016-03-04 NOTE — Progress Notes (Signed)
  Subjective:    CC: Drainage from eyes  HPI: For months this pleasant 50 year old female has had clear drainage from both of her eyes, stuffiness as well as drainage from her nose, mild sore throat. Symptoms are mild, persistent. She really doesn't use any antihistamines, nasal sprays or topical eyedrops. Vision is okay, no constitutional symptoms.  Past medical history:  Negative.  See flowsheet/record as well for more information.  Surgical history: Negative.  See flowsheet/record as well for more information.  Family history: Negative.  See flowsheet/record as well for more information.  Social history: Negative.  See flowsheet/record as well for more information.  Allergies, and medications have been entered into the medical record, reviewed, and no changes needed.   Review of Systems: No fevers, chills, night sweats, weight loss, chest pain, or shortness of breath.   Objective:    General: Well Developed, well nourished, and in no acute distress.  Neuro: Alert and oriented x3, extra-ocular muscles intact, sensation grossly intact.  HEENT: Normocephalic, atraumatic, pupils equal round reactive to light, neck supple, no masses, no lymphadenopathy, thyroid nonpalpable. Visible allergic shiners, no conjunctival injection, oropharynx and ear canals are unremarkable, nasopharynx shows somewhat boggy and erythematous turbinates with clear mucus. Skin: Warm and dry, no rashes. Cardiac: Regular rate and rhythm, no murmurs rubs or gallops, no lower extremity edema.  Respiratory: Clear to auscultation bilaterally. Not using accessory muscles, speaking in full sentences.  Impression and Recommendations:    Allergic conjunctivitis We will treat this aggressively, fexofenadine, Optivar, Flonase. Return to see me in one month for a physical and to follow up allergies.

## 2016-03-19 ENCOUNTER — Other Ambulatory Visit: Payer: Self-pay

## 2016-03-19 MED ORDER — LORATADINE 10 MG PO TABS: 10.0000 mg | ORAL_TABLET | Freq: Every day | ORAL | 11 refills | Status: DC

## 2016-03-19 NOTE — Progress Notes (Signed)
Patient would like to switch from Allegra to Claritin. Please advise.

## 2016-04-06 ENCOUNTER — Ambulatory Visit (INDEPENDENT_AMBULATORY_CARE_PROVIDER_SITE_OTHER): Payer: 59

## 2016-04-06 ENCOUNTER — Encounter: Payer: Self-pay | Admitting: Sports Medicine

## 2016-04-06 ENCOUNTER — Ambulatory Visit (INDEPENDENT_AMBULATORY_CARE_PROVIDER_SITE_OTHER): Payer: 59 | Admitting: Sports Medicine

## 2016-04-06 VITALS — BP 124/76 | HR 83 | Resp 16 | Wt 147.3 lb

## 2016-04-06 DIAGNOSIS — M1811 Unilateral primary osteoarthritis of first carpometacarpal joint, right hand: Secondary | ICD-10-CM

## 2016-04-06 DIAGNOSIS — R011 Cardiac murmur, unspecified: Secondary | ICD-10-CM | POA: Diagnosis not present

## 2016-04-06 DIAGNOSIS — Z0000 Encounter for general adult medical examination without abnormal findings: Secondary | ICD-10-CM | POA: Diagnosis not present

## 2016-04-06 DIAGNOSIS — R300 Dysuria: Secondary | ICD-10-CM | POA: Insufficient documentation

## 2016-04-06 DIAGNOSIS — M19041 Primary osteoarthritis, right hand: Secondary | ICD-10-CM | POA: Diagnosis not present

## 2016-04-06 LAB — POCT URINALYSIS DIPSTICK
Bilirubin, UA: NEGATIVE
Glucose, UA: NEGATIVE
Ketones, UA: NEGATIVE
Leukocytes, UA: NEGATIVE
Nitrite, UA: NEGATIVE
Protein, UA: NEGATIVE
Spec Grav, UA: 1.01
Urobilinogen, UA: 0.2
pH, UA: 6

## 2016-04-06 MED ORDER — CEPHALEXIN 500 MG PO CAPS: 500.0000 mg | ORAL_CAPSULE | Freq: Two times a day (BID) | ORAL | 0 refills | Status: DC

## 2016-04-06 MED ORDER — DICLOFENAC SODIUM 75 MG PO TBEC: 75.0000 mg | DELAYED_RELEASE_TABLET | Freq: Two times a day (BID) | ORAL | 3 refills | Status: AC

## 2016-04-06 NOTE — Assessment & Plan Note (Signed)
Starting Voltaren. X-rays. Return in one month.

## 2016-04-06 NOTE — Assessment & Plan Note (Signed)
Urinalysis shows some blood. Treating with cephalexin, urine culture, return in one month for this.

## 2016-04-06 NOTE — Assessment & Plan Note (Signed)
Checking blood work, echocardiogram. Patient is asymptomatic.

## 2016-04-06 NOTE — Assessment & Plan Note (Signed)
Physical exam as above, ordering routine blood work.

## 2016-04-06 NOTE — Progress Notes (Signed)
  Subjective:    CC: Annual physical  HPI:  Annual physical exam: Up-to-date on cervical cancer screening, needs blood work.  Right hand pain: Present for several months, localized at the base of the thumb, hasn't tried any oral medications for this yet. Pain is moderate, persistent, localized without radiation.  Dysuria: With frequency, urgency, only minimal burning and some pelvic pressure. Has had bladder infections before. No fevers or chills, no flank pain.  Past medical history:  Negative.  See flowsheet/record as well for more information.  Surgical history: Negative.  See flowsheet/record as well for more information.  Family history: Negative.  See flowsheet/record as well for more information.  Social history: Negative.  See flowsheet/record as well for more information.  Allergies, and medications have been entered into the medical record, reviewed, and no changes needed.    Review of Systems: No headache, visual changes, nausea, vomiting, diarrhea, constipation, dizziness, abdominal pain, skin rash, fevers, chills, night sweats, swollen lymph nodes, weight loss, chest pain, body aches, joint swelling, muscle aches, shortness of breath, mood changes, visual or auditory hallucinations.  Objective:   General: Well Developed, well nourished, and in no acute distress.  Neuro: Alert and oriented x3, extra-ocular muscles intact, sensation grossly intact. Cranial nerves II through XII are intact, motor, sensory, and coordinative functions are all intact. HEENT: Normocephalic, atraumatic, pupils equal round reactive to light, neck supple, no masses, no lymphadenopathy, thyroid nonpalpable. Oropharynx, nasopharynx, external ear canals are unremarkable. Skin: Warm and dry, no rashes noted.  Cardiac: Regular rate and rhythm, 2/6 systolic ejection murmur Respiratory: Clear to auscultation bilaterally. Not using accessory muscles, speaking in full sentences.  Abdominal: Soft, nontender,  nondistended, positive bowel sounds, no masses, no organomegaly.  Musculoskeletal: Shoulder, elbow, wrist, hip, knee, ankle stable, and with full range of motion. There is tenderness to palpation at the trapeziometacarpal joint, negative Tinel's, Phalen's signs, negative Finkelstein sign.  Urinalysis is positive for trace blood.  Impression and Recommendations:    The patient was counselled, risk factors were discussed, anticipatory guidance given.  Arthritis of carpometacarpal (CMC) joint of right thumb Starting Voltaren. X-rays. Return in one month.  Annual physical exam Physical exam as above, ordering routine blood work.  Dysuria Urinalysis shows some blood. Treating with cephalexin, urine culture, return in one month for this.

## 2016-04-08 LAB — URINE CULTURE: Organism ID, Bacteria: NO GROWTH

## 2016-04-14 ENCOUNTER — Ambulatory Visit (HOSPITAL_BASED_OUTPATIENT_CLINIC_OR_DEPARTMENT_OTHER)
Admission: RE | Admit: 2016-04-14 | Discharge: 2016-04-14 | Disposition: A | Discharge status: Home / Self Care | Payer: 59 | Source: Ambulatory Visit | Attending: Sports Medicine | Admitting: Sports Medicine

## 2016-04-14 DIAGNOSIS — R011 Cardiac murmur, unspecified: Secondary | ICD-10-CM | POA: Diagnosis present

## 2016-04-14 DIAGNOSIS — I119 Hypertensive heart disease without heart failure: Secondary | ICD-10-CM | POA: Insufficient documentation

## 2016-04-15 LAB — COMPREHENSIVE METABOLIC PANEL
ALT: 12 U/L (ref 6–29)
AST: 27 U/L (ref 10–35)
Alkaline Phosphatase: 60 U/L (ref 33–115)
BUN: 13 mg/dL (ref 7–25)
Creat: 0.8 mg/dL (ref 0.50–1.10)
Glucose, Bld: 92 mg/dL (ref 65–99)
Total Bilirubin: 0.6 mg/dL (ref 0.2–1.2)
Total Protein: 7.3 g/dL (ref 6.1–8.1)

## 2016-04-15 LAB — COMPREHENSIVE METABOLIC PANEL WITH GFR
Albumin: 4.1 g/dL (ref 3.6–5.1)
CO2: 23 mmol/L (ref 20–31)
Calcium: 9.6 mg/dL (ref 8.6–10.2)
Chloride: 102 mmol/L (ref 98–110)
Potassium: 3.8 mmol/L (ref 3.5–5.3)
Sodium: 137 mmol/L (ref 135–146)

## 2016-04-15 LAB — LIPID PANEL
Cholesterol: 152 mg/dL (ref <200)
HDL: 67 mg/dL (ref >50)
LDL Cholesterol: 75 mg/dL (ref <100)
Total CHOL/HDL Ratio: 2.3 ratio (ref <5.0)
Triglycerides: 51 mg/dL (ref <150)
VLDL: 10 mg/dL (ref <30)

## 2016-04-15 LAB — TSH: TSH: 0.72 mIU/L

## 2016-04-15 LAB — CBC
HCT: 38.1 % (ref 35.0–45.0)
Hemoglobin: 12.6 g/dL (ref 11.7–15.5)
MCH: 28.9 pg (ref 27.0–33.0)
MCHC: 33.1 g/dL (ref 32.0–36.0)
MCV: 87.4 fL (ref 80.0–100.0)
MPV: 9.9 fL (ref 7.5–12.5)
Platelets: 268 10*3/uL (ref 140–400)
RBC: 4.36 MIL/uL (ref 3.80–5.10)
RDW: 13.9 % (ref 11.0–15.0)
WBC: 4.7 10*3/uL (ref 3.8–10.8)

## 2016-04-15 LAB — HEMOGLOBIN A1C
Hgb A1c MFr Bld: 5 % (ref <5.7)
Mean Plasma Glucose: 97 mg/dL

## 2016-04-15 LAB — VITAMIN D 25 HYDROXY (VIT D DEFICIENCY, FRACTURES): Vit D, 25-Hydroxy: 36 ng/mL (ref 30–100)

## 2016-04-15 LAB — HIV ANTIBODY (ROUTINE TESTING W REFLEX): HIV 1&2 Ab, 4th Generation: NONREACTIVE

## 2016-08-12 DIAGNOSIS — R102 Pelvic and perineal pain: Secondary | ICD-10-CM | POA: Diagnosis not present

## 2017-01-11 DIAGNOSIS — R102 Pelvic and perineal pain: Secondary | ICD-10-CM | POA: Diagnosis not present

## 2017-01-12 DIAGNOSIS — R102 Pelvic and perineal pain: Secondary | ICD-10-CM | POA: Diagnosis not present

## 2017-01-17 DIAGNOSIS — N390 Urinary tract infection, site not specified: Secondary | ICD-10-CM | POA: Diagnosis not present

## 2017-01-17 DIAGNOSIS — R102 Pelvic and perineal pain: Secondary | ICD-10-CM | POA: Diagnosis not present

## 2017-02-22 DIAGNOSIS — Z124 Encounter for screening for malignant neoplasm of cervix: Secondary | ICD-10-CM | POA: Diagnosis not present

## 2017-02-22 DIAGNOSIS — Z01419 Encounter for gynecological examination (general) (routine) without abnormal findings: Secondary | ICD-10-CM | POA: Diagnosis not present

## 2017-02-22 DIAGNOSIS — Z6822 Body mass index (BMI) 22.0-22.9, adult: Secondary | ICD-10-CM | POA: Diagnosis not present

## 2017-02-25 ENCOUNTER — Ambulatory Visit: Payer: 59 | Admitting: Sports Medicine

## 2017-02-25 ENCOUNTER — Encounter: Payer: Self-pay | Admitting: Sports Medicine

## 2017-02-25 DIAGNOSIS — R002 Palpitations: Secondary | ICD-10-CM

## 2017-02-25 LAB — COMPREHENSIVE METABOLIC PANEL
AST: 18 U/L (ref 10–35)
Albumin: 4 g/dL (ref 3.6–5.1)
Chloride: 108 mmol/L (ref 98–110)
Creat: 0.84 mg/dL (ref 0.50–1.10)
Globulin: 2.8 g/dL (calc) (ref 1.9–3.7)
Glucose, Bld: 88 mg/dL (ref 65–99)
Sodium: 140 mmol/L (ref 135–146)
Total Protein: 6.8 g/dL (ref 6.1–8.1)

## 2017-02-25 LAB — CBC
HCT: 37.2 % (ref 35.0–45.0)
Hemoglobin: 12.2 g/dL (ref 11.7–15.5)
MCH: 28.2 pg (ref 27.0–33.0)
MCHC: 32.8 g/dL (ref 32.0–36.0)
MCV: 86.1 fL (ref 80.0–100.0)
MPV: 10.9 fL (ref 7.5–12.5)
Platelets: 265 10*3/uL (ref 140–400)
RBC: 4.32 10*6/uL (ref 3.80–5.10)
RDW: 12.8 % (ref 11.0–15.0)
WBC: 3.4 10*3/uL — ABNORMAL LOW (ref 3.8–10.8)

## 2017-02-25 LAB — COMPREHENSIVE METABOLIC PANEL WITH GFR
AG Ratio: 1.4 (calc) (ref 1.0–2.5)
ALT: 7 U/L (ref 6–29)
Alkaline phosphatase (APISO): 49 U/L (ref 33–115)
BUN: 11 mg/dL (ref 7–25)
CO2: 26 mmol/L (ref 20–32)
Calcium: 9.3 mg/dL (ref 8.6–10.2)
Potassium: 3.8 mmol/L (ref 3.5–5.3)
Total Bilirubin: 0.4 mg/dL (ref 0.2–1.2)

## 2017-02-25 LAB — TSH: TSH: 2.12 m[IU]/L

## 2017-02-25 NOTE — Addendum Note (Signed)
Addended by: Baird KayUGLAS, Cyrilla Durkin M on: 02/25/2017 09:12 AM   Modules accepted: Orders

## 2017-02-25 NOTE — Assessment & Plan Note (Signed)
Unclear etiology, normal twelve-lead ECG labs. I am going to set her up for an event monitor. Return after event monitoring to go over results.   She did have an echocardiogram earlier this year that was normal.

## 2017-02-25 NOTE — Progress Notes (Signed)
  Subjective:    CC: Palpitations  HPI: This is a pleasant 51 year old female, she is previously healthy, over the past few weeks she has noted intermittent palpitations when going from sitting to laying flat at night.  No chest pain, no presyncope, no symptoms with exercise, intermittent, for the most part random.  They happen at least once per day.  Has never had a history of an arrhythmia, had an echocardiogram earlier this year that was structurally normal.  Past medical history:  Negative.  See flowsheet/record as well for more information.  Surgical history: Negative.  See flowsheet/record as well for more information.  Family history: Negative.  See flowsheet/record as well for more information.  Social history: Negative.  See flowsheet/record as well for more information.  Allergies, and medications have been entered into the medical record, reviewed, and no changes needed.   (To billers/coders, pertinent past medical, social, surgical, family history can be found in problem list, if problem list is marked as reviewed then this indicates that past medical, social, surgical, family history was also reviewed)  Review of Systems: No fevers, chills, night sweats, weight loss, chest pain, or shortness of breath.   Objective:    General: Well Developed, well nourished, and in no acute distress.  Neuro: Alert and oriented x3, extra-ocular muscles intact, sensation grossly intact.  HEENT: Normocephalic, atraumatic, pupils equal round reactive to light, neck supple, no masses, no lymphadenopathy, thyroid nonpalpable.  Skin: Warm and dry, no rashes. Cardiac: Regular rate and rhythm, no murmurs rubs or gallops, no lower extremity edema.  Respiratory: Clear to auscultation bilaterally. Not using accessory muscles, speaking in full sentences.  Twelve-lead ECG shows normal sinus rhythm, rate of 63, regular rhythm, no PR, ST abnormalities, normal axis.  Impression and Recommendations:     Palpitations Unclear etiology, normal twelve-lead ECG labs. I am going to set her up for an event monitor. Return after event monitoring to go over results.   She did have an echocardiogram earlier this year that was normal. ___________________________________________ Ihor Austinhomas J. Benjamin Stainhekkekandam, M.D., ABFM., CAQSM. Primary Care and Sports Medicine [facility]  Adjunct Instructor of Family Medicine  [employer]

## 2017-03-31 ENCOUNTER — Telehealth: Payer: Self-pay

## 2017-03-31 DIAGNOSIS — R002 Palpitations: Secondary | ICD-10-CM

## 2017-03-31 NOTE — Telephone Encounter (Signed)
Let us do 48 hours for now.

## 2017-03-31 NOTE — Telephone Encounter (Signed)
[facility] called inquiring about the Event Monitor. Note says that you would like monitor for 48-72 hours. They advised if 24-48 hours is adequate order a Holter monitor if not Reorder the Event monitor with exact hours you would like pt to wear it. Please assist.

## 2017-04-05 NOTE — Telephone Encounter (Signed)
New order is needed for a holter monitor. Please assist.

## 2017-04-05 NOTE — Telephone Encounter (Signed)
Done

## 2017-04-07 ENCOUNTER — Encounter: Payer: Self-pay | Admitting: Sports Medicine

## 2017-04-07 ENCOUNTER — Ambulatory Visit (INDEPENDENT_AMBULATORY_CARE_PROVIDER_SITE_OTHER): Payer: 59 | Admitting: Sports Medicine

## 2017-04-07 DIAGNOSIS — R002 Palpitations: Secondary | ICD-10-CM | POA: Diagnosis not present

## 2017-04-07 DIAGNOSIS — Z0000 Encounter for general adult medical examination without abnormal findings: Secondary | ICD-10-CM

## 2017-04-07 NOTE — Assessment & Plan Note (Signed)
Unremarkable physical. Routine labs ordered.

## 2017-04-07 NOTE — Assessment & Plan Note (Signed)
Unclear etiology, again normal twelve-lead ECG, we are still awaiting the 48-hour Holter monitor. She had an echocardiogram earlier this year that was normal.

## 2017-04-07 NOTE — Progress Notes (Signed)
Subjective:    CC: Annual physical exam  HPI:  Stacy Young is a very pleasant 52 year old female, she is here for her physical.  She has no complaints with the exception of her occasional palpitations when going to sleep.  She had an echocardiogram last year that was normal, denies any chest pain, shortness of breath, we had initially set her up for a Holter monitor which she has not yet had.  Recent blood work was normal, CBC, CMP, TSH.  I reviewed the past medical history, family history, social history, surgical history, and allergies today and no changes were needed.  Please see the problem list section below in epic for further details.  Past Medical History: Past Medical History:  Diagnosis Date  . BV (bacterial vaginosis) 2003  . Dermoid cyst 10/2001   left  . Endometriosis 01/2002  . H/O dysmenorrhea 08/07/2003  . H/O fatigue 08/2003  . H/O pelvic mass 06/2010  . H/O varicella   . Hx of abnormal menstrual cycle 10/2001  . Hx of constipation 06/2010  . Hx: UTI (urinary tract infection) 04/2006  . Irregular periods/menstrual cycles 09/2007  . Monilia infection 06/2006  . PMS (premenstrual syndrome) 10/2004  . Vaginal dryness 2002   Past Surgical History: Past Surgical History:  Procedure Laterality Date  . CYSTECTOMY  01/10/2007  . DILATION AND CURETTAGE OF UTERUS  2000  . WISDOM TOOTH EXTRACTION  10/1998   Social History: Social History   Socioeconomic History  . Marital status: Married    Spouse name: None  . Number of children: None  . Years of education: None  . Highest education level: None  Social Needs  . Financial resource strain: None  . Food insecurity - worry: None  . Food insecurity - inability: None  . Transportation needs - medical: None  . Transportation needs - non-medical: None  Occupational History  . Occupation: Physicist, medicalDeputy    [employer] [employer]  Tobacco Use  . Smoking status: Never Smoker  . Smokeless tobacco: Never Used  Substance and Sexual  Activity  . Alcohol use: No  . Drug use: No  . Sexual activity: Yes    Birth control/protection: IUD    Comment: MIRENA  Other Topics Concern  . None  Social History Narrative  . None   Family History: Family History  Problem Relation Age of Onset  . Breast cancer Other   . Heart disease Father        MI  . Hypertension Father   . Stroke Father   . Hypertension Mother    Allergies: Allergies  Allergen Reactions  . Darvocet [Propoxyphene N-Acetaminophen] Nausea Only  . Latex   . Naproxen    Medications: See med rec.  Review of Systems: No headache, visual changes, nausea, vomiting, diarrhea, constipation, dizziness, abdominal pain, skin rash, fevers, chills, night sweats, swollen lymph nodes, weight loss, chest pain, body aches, joint swelling, muscle aches, shortness of breath, mood changes, visual or auditory hallucinations.  Objective:    General: Well Developed, well nourished, and in no acute distress.  Neuro: Alert and oriented x3, extra-ocular muscles intact, sensation grossly intact. Cranial nerves II through XII are intact, motor, sensory, and coordinative functions are all intact. HEENT: Normocephalic, atraumatic, pupils equal round reactive to light, neck supple, no masses, no lymphadenopathy, thyroid nonpalpable. Oropharynx, nasopharynx, external ear canals are unremarkable. Skin: Warm and dry, no rashes noted.  Cardiac: Regular rate and rhythm, no murmurs rubs or gallops.  She does have a variable split  S2 heart sound. Respiratory: Clear to auscultation bilaterally. Not using accessory muscles, speaking in full sentences.  Abdominal: Soft, nontender, nondistended, positive bowel sounds, no masses, no organomegaly.  Musculoskeletal: Shoulder, elbow, wrist, hip, knee, ankle stable, and with full range of motion.  Impression and Recommendations:    The patient was counselled, risk factors were discussed, anticipatory guidance given.  Annual physical  exam Unremarkable physical. Routine labs ordered.  Palpitations Unclear etiology, again normal twelve-lead ECG, we are still awaiting the 48-hour Holter monitor. She had an echocardiogram earlier this year that was normal.  ___________________________________________ Ihor Austin. Benjamin Stain, M.D., ABFM., CAQSM. Primary Care and Sports Medicine [facility]  Adjunct Instructor of Family Medicine  [employer]

## 2017-04-08 LAB — COMPREHENSIVE METABOLIC PANEL WITH GFR
AG Ratio: 1.4 (calc) (ref 1.0–2.5)
Albumin: 4.2 g/dL (ref 3.6–5.1)
CO2: 22 mmol/L (ref 20–32)
Glucose, Bld: 74 mg/dL (ref 65–99)
Potassium: 3.7 mmol/L (ref 3.5–5.3)
Sodium: 138 mmol/L (ref 135–146)
Total Bilirubin: 0.6 mg/dL (ref 0.2–1.2)

## 2017-04-08 LAB — CBC
HCT: 37.2 % (ref 35.0–45.0)
Hemoglobin: 12.5 g/dL (ref 11.7–15.5)
MCH: 28.5 pg (ref 27.0–33.0)
MCHC: 33.6 g/dL (ref 32.0–36.0)
MCV: 84.7 fL (ref 80.0–100.0)
MPV: 11.1 fL (ref 7.5–12.5)
Platelets: 243 Thousand/uL (ref 140–400)
RBC: 4.39 Million/uL (ref 3.80–5.10)
RDW: 12.9 % (ref 11.0–15.0)
WBC: 2.8 10*3/uL — ABNORMAL LOW (ref 3.8–10.8)

## 2017-04-08 LAB — COMPREHENSIVE METABOLIC PANEL
ALT: 8 U/L (ref 6–29)
AST: 20 U/L (ref 10–35)
Alkaline phosphatase (APISO): 46 U/L (ref 33–115)
BUN: 8 mg/dL (ref 7–25)
Calcium: 9.6 mg/dL (ref 8.6–10.2)
Chloride: 103 mmol/L (ref 98–110)
Creat: 0.83 mg/dL (ref 0.50–1.10)
Globulin: 2.9 g/dL (calc) (ref 1.9–3.7)
Total Protein: 7.1 g/dL (ref 6.1–8.1)

## 2017-04-08 LAB — LIPID PANEL W/REFLEX DIRECT LDL
Cholesterol: 167 mg/dL (ref <200)
HDL: 55 mg/dL (ref >50)
LDL Cholesterol (Calc): 96 mg/dL
Non-HDL Cholesterol (Calc): 112 mg/dL (calc) (ref <130)
Total CHOL/HDL Ratio: 3 (calc) (ref <5.0)
Triglycerides: 69 mg/dL (ref <150)

## 2017-04-08 LAB — HEMOGLOBIN A1C
Hgb A1c MFr Bld: 5.4 % of total Hgb (ref <5.7)
Mean Plasma Glucose: 108 (calc)
eAG (mmol/L): 6 (calc)

## 2017-04-08 LAB — TSH: TSH: 1.02 mIU/L

## 2017-04-08 LAB — VITAMIN D 25 HYDROXY (VIT D DEFICIENCY, FRACTURES): Vit D, 25-Hydroxy: 31 ng/mL (ref 30–100)

## 2017-04-13 ENCOUNTER — Telehealth: Payer: Self-pay | Admitting: Sports Medicine

## 2017-04-13 NOTE — Telephone Encounter (Signed)
If she is referring to the Holter monitor than that is completely fine.  But she needs to contact the cardiology office for scheduling.  They are the ones that are going to be doing the test.

## 2017-04-21 ENCOUNTER — Ambulatory Visit (INDEPENDENT_AMBULATORY_CARE_PROVIDER_SITE_OTHER): Payer: 59

## 2017-04-21 DIAGNOSIS — R002 Palpitations: Secondary | ICD-10-CM | POA: Diagnosis not present

## 2017-04-26 ENCOUNTER — Telehealth: Payer: Self-pay | Admitting: *Deleted

## 2017-04-26 NOTE — Telephone Encounter (Signed)
Returning call regarding holter monitor.  If lead was reattached once it was found disconnected, we may still have enough data.  We will need monitor returned to our office to upload for [facility] to evaluate.  If too much data has been lost, we will repeat the test.

## 2017-06-08 DIAGNOSIS — R319 Hematuria, unspecified: Secondary | ICD-10-CM | POA: Diagnosis not present

## 2017-06-08 DIAGNOSIS — R399 Unspecified symptoms and signs involving the genitourinary system: Secondary | ICD-10-CM | POA: Diagnosis not present

## 2017-06-08 DIAGNOSIS — R102 Pelvic and perineal pain: Secondary | ICD-10-CM | POA: Diagnosis not present

## 2017-06-30 ENCOUNTER — Encounter: Payer: Self-pay | Admitting: Family Medicine

## 2017-06-30 ENCOUNTER — Ambulatory Visit: Payer: 59 | Admitting: Family Medicine

## 2017-06-30 VITALS — BP 132/61 | HR 82 | Ht 67.0 in | Wt 139.0 lb

## 2017-06-30 DIAGNOSIS — N809 Endometriosis, unspecified: Secondary | ICD-10-CM | POA: Diagnosis not present

## 2017-06-30 DIAGNOSIS — R319 Hematuria, unspecified: Secondary | ICD-10-CM | POA: Diagnosis not present

## 2017-06-30 DIAGNOSIS — R102 Pelvic and perineal pain: Secondary | ICD-10-CM | POA: Diagnosis not present

## 2017-06-30 DIAGNOSIS — R300 Dysuria: Secondary | ICD-10-CM | POA: Diagnosis not present

## 2017-06-30 LAB — POCT URINALYSIS DIPSTICK
Bilirubin, UA: NEGATIVE
GLUCOSE UA: NEGATIVE
Ketones, UA: NEGATIVE
LEUKOCYTES UA: NEGATIVE
NITRITE UA: NEGATIVE
PH UA: 7 (ref 5.0–8.0)
Protein, UA: NEGATIVE
SPEC GRAV UA: 1.01 (ref 1.010–1.025)
UROBILINOGEN UA: 0.2 U/dL

## 2017-06-30 NOTE — Progress Notes (Signed)
Subjective:    Patient ID: Stacy Young, female    DOB: 1965-04-13, 52 y.o.   MRN: [id number]  HPI 52 year old female comes in today complaining of l suprapubic pelvic pain is a little worse on the right side.  She does have a history of endometriosis.  In 2000 she had a D&C.  Then in 2008 she had a cyst removed from 1 of her ovaries.  Over the last couple of months she has had intermittent pain in the pelvic area.  She is actually been to her OB/GYN twice.  The first time she went she had a checkup and the pain just resolved.  The second time it occurred she went back again and was reevaluated.  She said she had a pelvic ultrasound performed and and a pelvic exam.  They also did a urine.  She was told the results were essentially normal but that she did have a cyst on 1 of her ovaries.  But she is not sure which side.  She describes her pain as a deep ache inconsistent.  She says it actually has been waking her up at night intermittently with a throbbing sensation.  She denies any fever sweats or chills.  She has had some intermittent nausea but no vomiting.  Her periods started at this past Monday approximately 4 days ago and she has had a little bit of burning with urination since then.  She does have some occasional low back pain.  She has lost some weight over the last couple of years not purposely but she has been eating gluten-free.  She did have some problems with urinating consistently.  It was stopping and starting and recently started basal oil and Jennifer oil and says that has actually been better and she is been urinating normally.  She says in the past she has been told that she has had blood in her urine but unfortunately she tends to spot a lot with her menstrual cycles and so is never really sure if that is what might be causing it but she never sees any gross hematuria.  Does tend to struggle with constipation and says that her carpal tunnel has been flaring.  She says that she stays  well-hydrated and eats lots of salads it does seem to be better.  Has had blood work including a normal thyroid level in January.   Review of Systems     BP 132/61   Pulse 82   Ht 5\' 7"  (1.702 m)   Wt 139 lb (63 kg)   LMP 06/20/2017 (Approximate) Comment: IUD 01/24/2014  SpO2 100%   BMI 21.77 kg/m     Allergies  Allergen Reactions  . Darvocet [Propoxyphene N-Acetaminophen] Nausea Only  . Latex   . Naproxen     Past Medical History:  Diagnosis Date  . BV (bacterial vaginosis) 2003  . Dermoid cyst 10/2001   left  . Endometriosis 01/2002  . H/O dysmenorrhea 08/07/2003  . H/O fatigue 08/2003  . H/O pelvic mass 06/2010  . H/O varicella   . Hx of abnormal menstrual cycle 10/2001  . Hx of constipation 06/2010  . Hx: UTI (urinary tract infection) 04/2006  . Irregular periods/menstrual cycles 09/2007  . Monilia infection 06/2006  . PMS (premenstrual syndrome) 10/2004  . Vaginal dryness 2002    Past Surgical History:  Procedure Laterality Date  . CYSTECTOMY  01/10/2007  . DILATION AND CURETTAGE OF UTERUS  2000  . WISDOM TOOTH EXTRACTION  10/1998  Social History   Socioeconomic History  . Marital status: Married    Spouse name: Not on file  . Number of children: Not on file  . Years of education: Not on file  . Highest education level: Not on file  Occupational History  . Occupation: Physicist, medicalDeputy    [employer] [employer]  Social Needs  . Financial resource strain: Not on file  . Food insecurity:    Worry: Not on file    Inability: Not on file  . Transportation needs:    Medical: Not on file    Non-medical: Not on file  Tobacco Use  . Smoking status: Never Smoker  . Smokeless tobacco: Never Used  Substance and Sexual Activity  . Alcohol use: No  . Drug use: No  . Sexual activity: Yes    Birth control/protection: IUD    Comment: MIRENA  Lifestyle  . Physical activity:    Days per week: Not on file    Minutes per session: Not on file  . Stress: Not on file   Relationships  . Social connections:    Talks on phone: Not on file    Gets together: Not on file    Attends religious service: Not on file    Active member of club or organization: Not on file    Attends meetings of clubs or organizations: Not on file    Relationship status: Not on file  . Intimate partner violence:    Fear of current or ex partner: Not on file    Emotionally abused: Not on file    Physically abused: Not on file    Forced sexual activity: Not on file  Other Topics Concern  . Not on file  Social History Narrative  . Not on file    Family History  Problem Relation Age of Onset  . Breast cancer Other   . Heart disease Father        MI  . Hypertension Father   . Stroke Father   . Hypertension Mother     Outpatient Encounter Medications as of 06/30/2017  Medication Sig  . azelastine (OPTIVAR) 0.05 % ophthalmic solution Place 2 drops into both eyes 2 (two) times daily.  Marland Kitchen. ketorolac (ACULAR) 0.5 % ophthalmic solution Place 1 drop into the left eye 4 (four) times daily.  Marland Kitchen. levonorgestrel (MIRENA) 20 MCG/24HR IUD 1 each by Intrauterine route once.  . loratadine (CLARITIN) 10 MG tablet Take 1 tablet (10 mg total) by mouth daily.  . Probiotic Product (PROBIOTIC ACIDOPHILUS PO) Take by mouth.   No facility-administered encounter medications on file as of 06/30/2017.         Objective:   Physical Exam  Constitutional: She is oriented to person, place, and time. She appears well-developed and well-nourished.  HENT:  Head: Normocephalic and atraumatic.  Cardiovascular: Normal rate, regular rhythm and normal heart sounds.  Pulmonary/Chest: Effort normal and breath sounds normal.  Abdominal: Soft. Bowel sounds are normal. She exhibits no distension and no mass. There is no tenderness. There is no rebound and no guarding.  Neurological: She is alert and oriented to person, place, and time.  Skin: Skin is warm and dry.  Psychiatric: She has a normal mood and affect.  Her behavior is normal.        Assessment & Plan:  Pelvic pain-she is Artie had a pelvic ultrasound as well as a pelvic exam with her OB/GYN.  Recommend repeat urinalysis and send for urine culture.  We discussed the  option of also doing a urine for gonorrhea and chlamydia.  She has been having more cramping with her period which is not typical for her.  That has not happened for years.  She is with the same sexual partner.  We also discussed the possibility of getting a pelvic CT. consider appendicitis that her pain is closer to midline and not near McBurney's point. Consider interstitial cystitis.   Burning with urination-we will repeat UA as well as culture today.  It did show some trace lysed blood but otherwise negative so will send for micro scopic review.  Infection is unlikely at this point.

## 2017-07-01 LAB — URINALYSIS, MICROSCOPIC ONLY
BACTERIA UA: NONE SEEN /HPF
Hyaline Cast: NONE SEEN /LPF
RBC / HPF: NONE SEEN /HPF (ref 0–2)
Squamous Epithelial / LPF: NONE SEEN /HPF (ref <=5)
WBC UA: NONE SEEN /HPF (ref 0–5)

## 2017-07-01 LAB — URINE CULTURE
MICRO NUMBER:: 90450551
RESULT: NO GROWTH
SPECIMEN QUALITY:: ADEQUATE

## 2017-07-03 LAB — GC/CHLAMYDIA PROBE AMP
Chlamydia trachomatis, NAA: NEGATIVE
Neisseria gonorrhoeae by PCR: NEGATIVE

## 2017-07-04 ENCOUNTER — Other Ambulatory Visit: Payer: Self-pay

## 2017-07-04 DIAGNOSIS — R309 Painful micturition, unspecified: Secondary | ICD-10-CM

## 2017-07-04 DIAGNOSIS — R102 Pelvic and perineal pain: Secondary | ICD-10-CM

## 2017-07-05 DIAGNOSIS — R3912 Poor urinary stream: Secondary | ICD-10-CM | POA: Diagnosis not present

## 2017-07-05 DIAGNOSIS — R350 Frequency of micturition: Secondary | ICD-10-CM | POA: Diagnosis not present

## 2017-07-05 DIAGNOSIS — R102 Pelvic and perineal pain: Secondary | ICD-10-CM | POA: Diagnosis not present

## 2018-03-09 DIAGNOSIS — Z124 Encounter for screening for malignant neoplasm of cervix: Secondary | ICD-10-CM | POA: Diagnosis not present

## 2018-03-09 DIAGNOSIS — Z01419 Encounter for gynecological examination (general) (routine) without abnormal findings: Secondary | ICD-10-CM | POA: Diagnosis not present

## 2018-03-09 DIAGNOSIS — Z1231 Encounter for screening mammogram for malignant neoplasm of breast: Secondary | ICD-10-CM | POA: Diagnosis not present

## 2018-04-15 DIAGNOSIS — M5412 Radiculopathy, cervical region: Secondary | ICD-10-CM | POA: Diagnosis not present

## 2018-04-15 DIAGNOSIS — M531 Cervicobrachial syndrome: Secondary | ICD-10-CM | POA: Diagnosis not present

## 2018-04-15 DIAGNOSIS — M7551 Bursitis of right shoulder: Secondary | ICD-10-CM | POA: Diagnosis not present

## 2018-04-15 DIAGNOSIS — M546 Pain in thoracic spine: Secondary | ICD-10-CM | POA: Diagnosis not present

## 2018-04-18 DIAGNOSIS — M531 Cervicobrachial syndrome: Secondary | ICD-10-CM | POA: Diagnosis not present

## 2018-04-18 DIAGNOSIS — M5412 Radiculopathy, cervical region: Secondary | ICD-10-CM | POA: Diagnosis not present

## 2018-04-18 DIAGNOSIS — M7551 Bursitis of right shoulder: Secondary | ICD-10-CM | POA: Diagnosis not present

## 2018-04-18 DIAGNOSIS — M546 Pain in thoracic spine: Secondary | ICD-10-CM | POA: Diagnosis not present

## 2018-04-22 DIAGNOSIS — M531 Cervicobrachial syndrome: Secondary | ICD-10-CM | POA: Diagnosis not present

## 2018-04-22 DIAGNOSIS — M7551 Bursitis of right shoulder: Secondary | ICD-10-CM | POA: Diagnosis not present

## 2018-04-22 DIAGNOSIS — M5412 Radiculopathy, cervical region: Secondary | ICD-10-CM | POA: Diagnosis not present

## 2018-04-22 DIAGNOSIS — M546 Pain in thoracic spine: Secondary | ICD-10-CM | POA: Diagnosis not present

## 2018-04-25 DIAGNOSIS — M5412 Radiculopathy, cervical region: Secondary | ICD-10-CM | POA: Diagnosis not present

## 2018-04-25 DIAGNOSIS — M531 Cervicobrachial syndrome: Secondary | ICD-10-CM | POA: Diagnosis not present

## 2018-04-25 DIAGNOSIS — M7551 Bursitis of right shoulder: Secondary | ICD-10-CM | POA: Diagnosis not present

## 2018-04-25 DIAGNOSIS — M546 Pain in thoracic spine: Secondary | ICD-10-CM | POA: Diagnosis not present

## 2018-04-27 DIAGNOSIS — M7551 Bursitis of right shoulder: Secondary | ICD-10-CM | POA: Diagnosis not present

## 2018-04-27 DIAGNOSIS — M546 Pain in thoracic spine: Secondary | ICD-10-CM | POA: Diagnosis not present

## 2018-04-27 DIAGNOSIS — M531 Cervicobrachial syndrome: Secondary | ICD-10-CM | POA: Diagnosis not present

## 2018-04-27 DIAGNOSIS — M5412 Radiculopathy, cervical region: Secondary | ICD-10-CM | POA: Diagnosis not present

## 2018-05-02 DIAGNOSIS — M7551 Bursitis of right shoulder: Secondary | ICD-10-CM | POA: Diagnosis not present

## 2018-05-02 DIAGNOSIS — M531 Cervicobrachial syndrome: Secondary | ICD-10-CM | POA: Diagnosis not present

## 2018-05-02 DIAGNOSIS — M546 Pain in thoracic spine: Secondary | ICD-10-CM | POA: Diagnosis not present

## 2018-05-02 DIAGNOSIS — M5412 Radiculopathy, cervical region: Secondary | ICD-10-CM | POA: Diagnosis not present

## 2018-10-07 IMAGING — DX DG HAND COMPLETE 3+V*R*
3 series · 3 of 3 positions shown · non-contrast
Comparison: None.

CLINICAL DATA: Pain proximal first digit.

EXAM:
RIGHT HAND - COMPLETE 3+ VIEW

[hand pa]
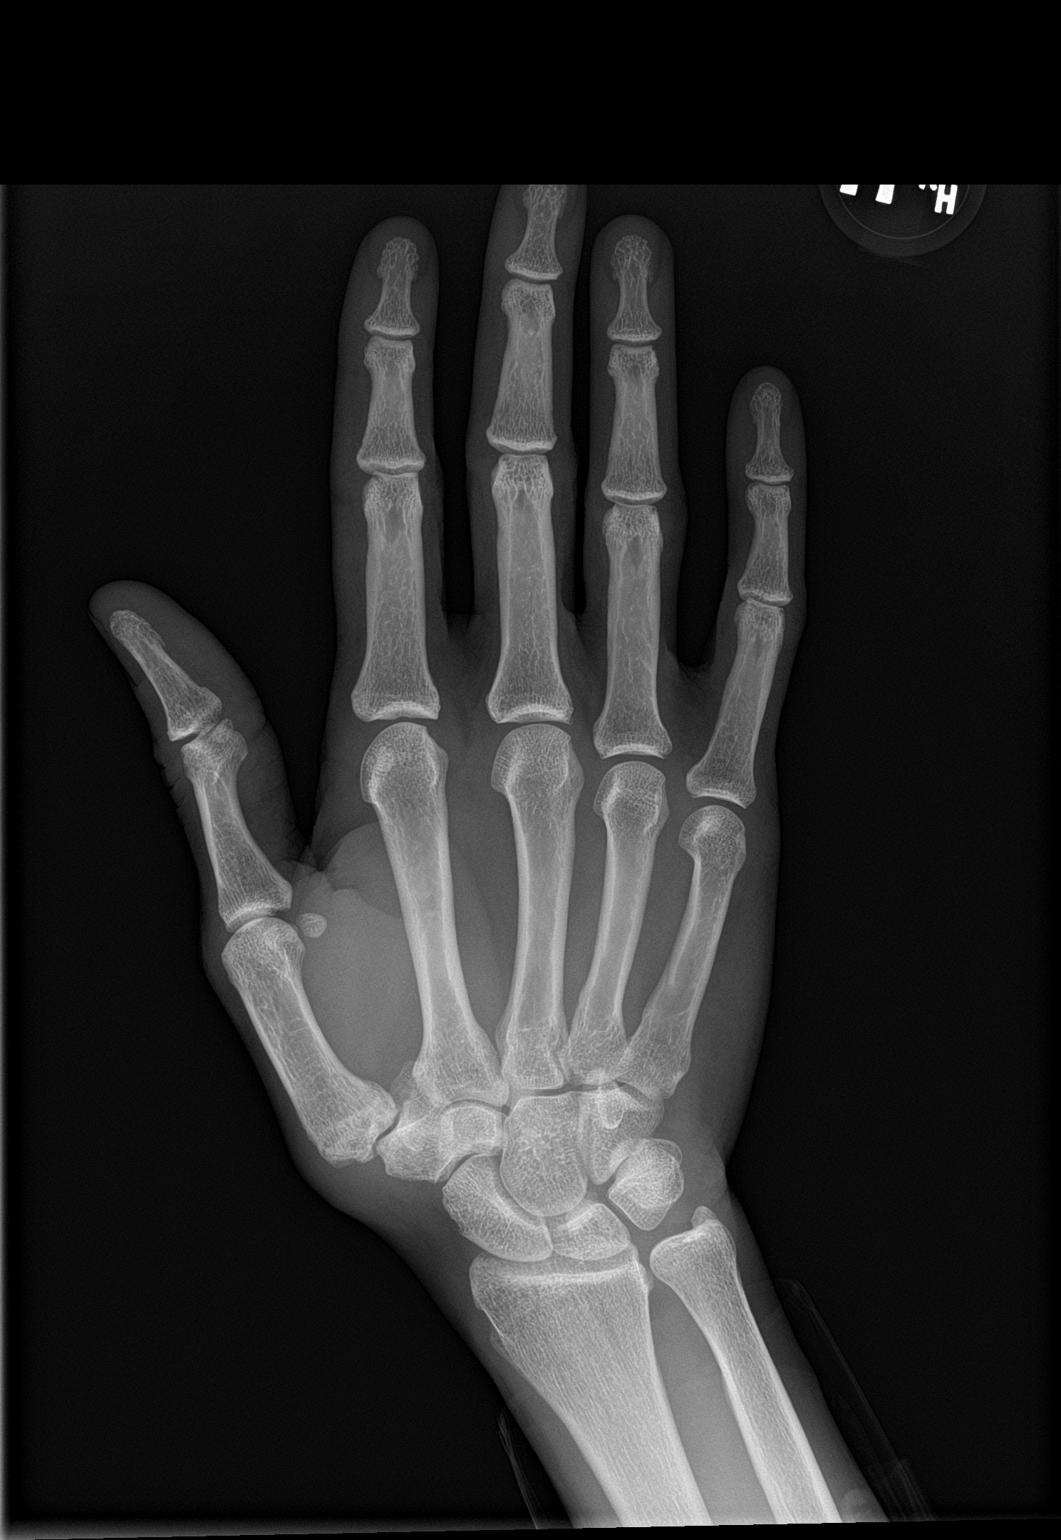

[hand obl]
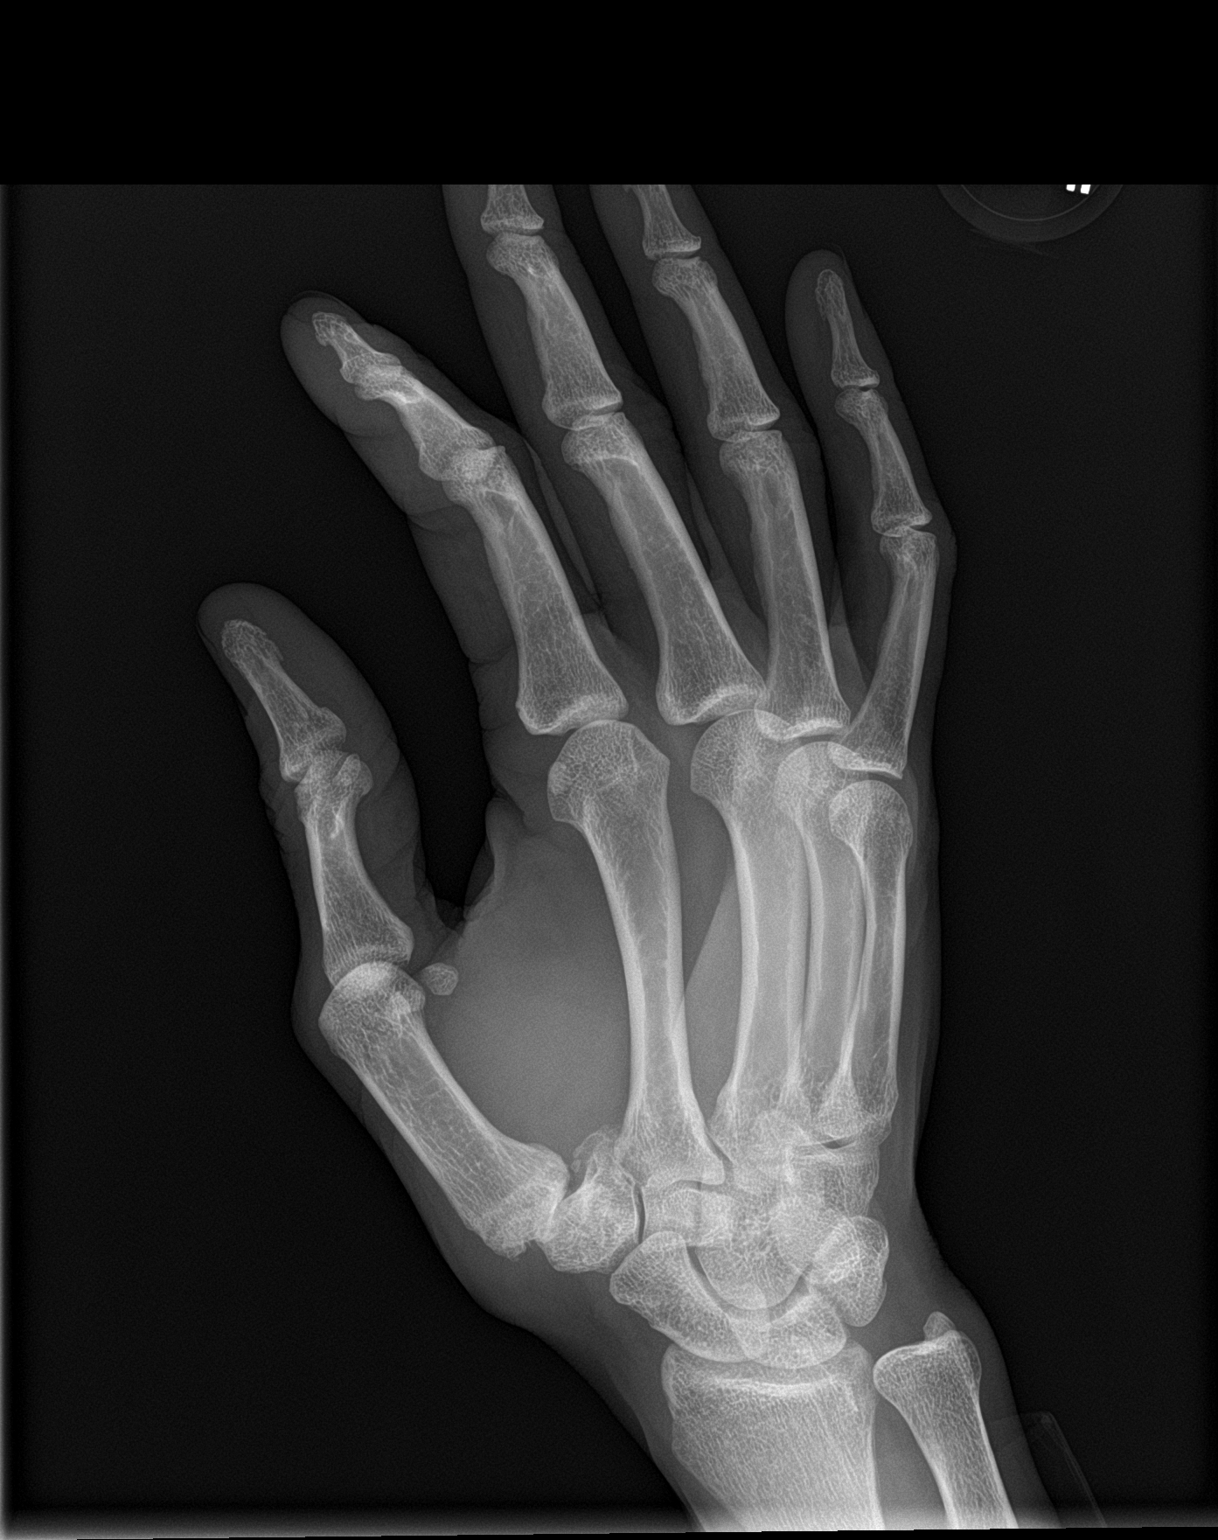

[hand lat]
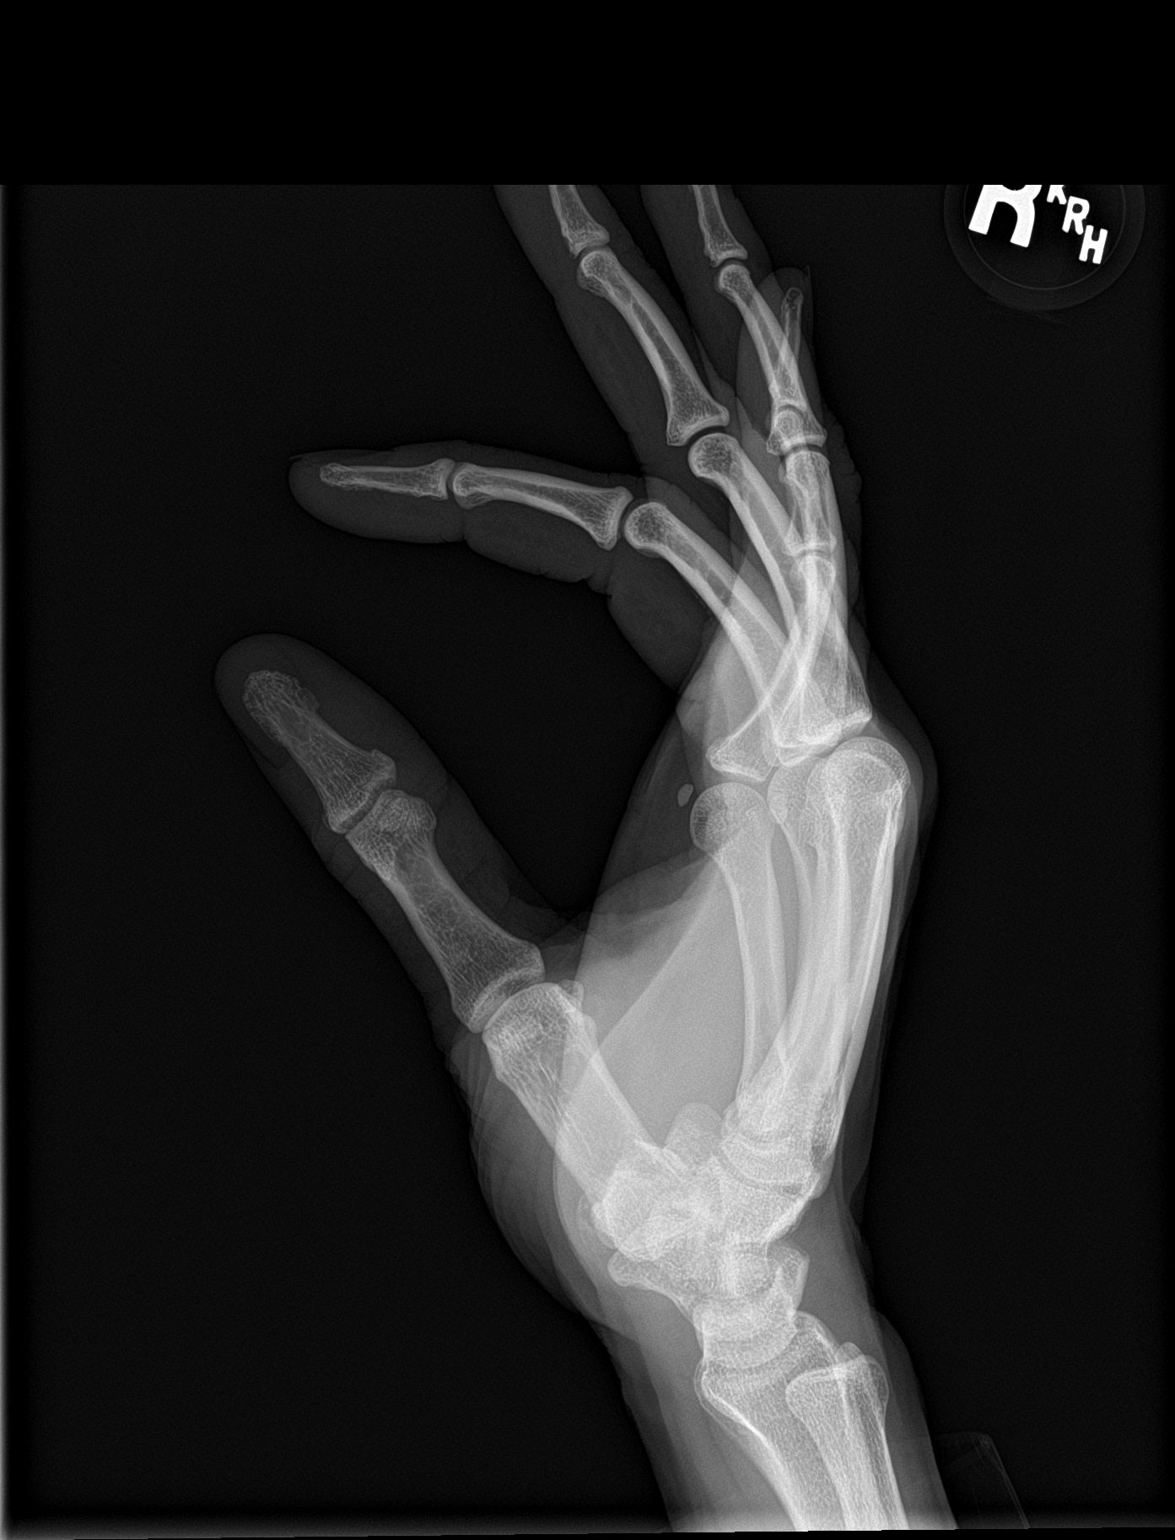

[3 of 3 positions shown; findings below may reference images not displayed]

FINDINGS: Frontal, oblique, and lateral views obtained. No fracture or
dislocation. There is moderate osteoarthritic change in the first
carpal-metacarpal joint. Other joint spaces appear normal. No
erosive change or intra-articular calcification.
IMPRESSION: Moderate osteoarthritic change in the first carpal -metacarpal
joint. There is mild bony remodeling in this area. There is mild
intra-articular calcification in this area. No erosive change. The
joint spaces appear unremarkable. No fracture or dislocation.

## 2020-09-08 ENCOUNTER — Ambulatory Visit (INDEPENDENT_AMBULATORY_CARE_PROVIDER_SITE_OTHER): Payer: 59 | Admitting: Sports Medicine

## 2020-09-08 ENCOUNTER — Encounter: Payer: Self-pay | Admitting: Sports Medicine

## 2020-09-08 VITALS — BP 166/96 | HR 65 | Ht 67.0 in | Wt 134.0 lb

## 2020-09-08 DIAGNOSIS — R011 Cardiac murmur, unspecified: Secondary | ICD-10-CM

## 2020-09-08 DIAGNOSIS — Z0000 Encounter for general adult medical examination without abnormal findings: Secondary | ICD-10-CM | POA: Diagnosis not present

## 2020-09-08 DIAGNOSIS — R030 Elevated blood-pressure reading, without diagnosis of hypertension: Secondary | ICD-10-CM | POA: Diagnosis not present

## 2020-09-08 DIAGNOSIS — N809 Endometriosis, unspecified: Secondary | ICD-10-CM

## 2020-09-08 DIAGNOSIS — I10 Essential (primary) hypertension: Secondary | ICD-10-CM | POA: Insufficient documentation

## 2020-09-08 NOTE — Assessment & Plan Note (Signed)
Murmur again noted. Echo in 2018 was essentially normal with the exception of some diastolic dysfunction. I would like an updated echocardiogram.

## 2020-09-08 NOTE — Assessment & Plan Note (Signed)
Routine annual physical exam as above, checking routine labs for She did have a colonoscopy sometime ago for abnormal stool habits, we will go ahead and restart screening with a Cologuard. She did have her cervical and breast cancer screening recently, we will get a hold of those records.

## 2020-09-08 NOTE — Assessment & Plan Note (Signed)
Blood pressure elevated today, no symptoms, she will get a blood pressure cuff and check it at home after relaxing for 10 to 15 minutes over the next week and send me a MyChart message.

## 2020-09-08 NOTE — Progress Notes (Addendum)
Subjective:    CC: Annual Physical Exam  HPI:  This patient is here for their annual physical  I reviewed the past medical history, family history, social history, surgical history, and allergies today and no changes were needed.  Please see the problem list section below in epic for further details.  Past Medical History: Past Medical History:  Diagnosis Date   BV (bacterial vaginosis) 2003   Dermoid cyst 10/2001   left   Endometriosis 01/2002   H/O dysmenorrhea 08/07/2003   H/O fatigue 08/2003   H/O pelvic mass 06/2010   H/O varicella    Hx of abnormal menstrual cycle 10/2001   Hx of constipation 06/2010   Hx: UTI (urinary tract infection) 04/2006   Irregular periods/menstrual cycles 09/2007   Monilia infection 06/2006   PMS (premenstrual syndrome) 10/2004   Vaginal dryness 2002   Past Surgical History: Past Surgical History:  Procedure Laterality Date   CYSTECTOMY  01/10/2007   DILATION AND CURETTAGE OF UTERUS  2000   WISDOM TOOTH EXTRACTION  10/1998   Social History: Social History   Socioeconomic History   Marital status: Married    Spouse name: Not on file   Number of children: Not on file   Years of education: Not on file   Highest education level: Not on file  Occupational History   Occupation: Deputy    Employer: [employer]  Tobacco Use   Smoking status: Never   Smokeless tobacco: Never  Substance and Sexual Activity   Alcohol use: No   Drug use: No   Sexual activity: Yes    Birth control/protection: I.U.D.    Comment: MIRENA  Other Topics Concern   Not on file  Social History Narrative   Not on file   Social Determinants of Health   Financial Resource Strain: Not on file  Food Insecurity: Not on file  Transportation Needs: Not on file  Physical Activity: Not on file  Stress: Not on file  Social Connections: Not on file   Family History: Family History  Problem Relation Age of Onset   Breast cancer Other    Heart disease Father        MI    Hypertension Father    Stroke Father    Hypertension Mother    Allergies: Allergies  Allergen Reactions   Darvocet [Propoxyphene N-Acetaminophen] Nausea Only   Latex    Naproxen    Medications: See med rec.  Review of Systems: No headache, visual changes, nausea, vomiting, diarrhea, constipation, dizziness, abdominal pain, skin rash, fevers, chills, night sweats, swollen lymph nodes, weight loss, chest pain, body aches, joint swelling, muscle aches, shortness of breath, mood changes, visual or auditory hallucinations.  Objective:    General: Well Developed, well nourished, and in no acute distress.  Neuro: Alert and oriented x3, extra-ocular muscles intact, sensation grossly intact. Cranial nerves II through XII are intact, motor, sensory, and coordinative functions are all intact. HEENT: Normocephalic, atraumatic, pupils equal round reactive to light, neck supple, no masses, no lymphadenopathy, thyroid nonpalpable. Oropharynx, nasopharynx, external ear canals are unremarkable. Skin: Warm and dry, no rashes noted.  Cardiac: Regular rate and rhythm, no rubs or gallops, 3/6 systolic ejection murmur across the precordium. Respiratory: Clear to auscultation bilaterally. Not using accessory muscles, speaking in full sentences.  Abdominal: Soft, nontender, nondistended, positive bowel sounds, no masses, no organomegaly.  Musculoskeletal: Shoulder, elbow, wrist, hip, knee, ankle stable, and with full range of motion.  Impression and Recommendations:    The  patient was counselled, risk factors were discussed, anticipatory guidance given.  Annual physical exam Routine annual physical exam as above, checking routine labs for She did have a colonoscopy sometime ago for abnormal stool habits, we will go ahead and restart screening with a Cologuard. She did have her cervical and breast cancer screening recently, we will get a hold of those records.  Endometriosis History of endometriosis,  also was having menometrorrhagia, she did have a Mirena placed about 7 years ago and this seemed to control her symptoms. She is starting to have increasing spotting, she will discuss this with her OB/GYN though she is likely nearing menopause and may be able to consider getting the Mirena removed. We will check some labs to ensure no systemic cause.  Systolic murmur Murmur again noted. Echo in 2018 was essentially normal with the exception of some diastolic dysfunction. I would like an updated echocardiogram.  Elevated blood pressure reading Blood pressure elevated today, no symptoms, she will get a blood pressure cuff and check it at home after relaxing for 10 to 15 minutes over the next week and send me a MyChart message.   ___________________________________________ Ihor Austin. Benjamin Stain, M.D., ABFM., CAQSM. Primary Care and Sports Medicine [facility]  Adjunct Professor of Family Medicine  [employer]

## 2020-09-08 NOTE — Assessment & Plan Note (Signed)
History of endometriosis, also was having menometrorrhagia, she did have a Mirena placed about 7 years ago and this seemed to control her symptoms. She is starting to have increasing spotting, she will discuss this with her OB/GYN though she is likely nearing menopause and may be able to consider getting the Mirena removed. We will check some labs to ensure no systemic cause.

## 2020-09-09 LAB — TSH: TSH: 2.09 mIU/L

## 2020-09-09 LAB — LIPID PANEL
Cholesterol: 207 mg/dL — ABNORMAL HIGH (ref <200)
HDL: 76 mg/dL (ref >=50)
LDL Cholesterol (Calc): 118 mg/dL (calc) — ABNORMAL HIGH
Non-HDL Cholesterol (Calc): 131 mg/dL (calc) — ABNORMAL HIGH (ref <130)
Total CHOL/HDL Ratio: 2.7 (calc) (ref <5.0)
Triglycerides: 44 mg/dL (ref <150)

## 2020-09-09 LAB — PROTIME-INR
INR: 1
Prothrombin Time: 10.1 s (ref 9.0–11.5)

## 2020-09-09 LAB — APTT: aPTT: 30 s (ref 23–32)

## 2020-09-09 LAB — CBC
HCT: 41.9 % (ref 35.0–45.0)
Hemoglobin: 13.5 g/dL (ref 11.7–15.5)
MCH: 28.6 pg (ref 27.0–33.0)
MCHC: 32.2 g/dL (ref 32.0–36.0)
MCV: 88.8 fL (ref 80.0–100.0)
MPV: 11.3 fL (ref 7.5–12.5)
Platelets: 265 10*3/uL (ref 140–400)
RBC: 4.72 10*6/uL (ref 3.80–5.10)
RDW: 12.6 % (ref 11.0–15.0)
WBC: 3.2 10*3/uL — ABNORMAL LOW (ref 3.8–10.8)

## 2020-09-09 LAB — COMPREHENSIVE METABOLIC PANEL
AG Ratio: 1.4 (calc) (ref 1.0–2.5)
ALT: 13 U/L (ref 6–29)
AST: 26 U/L (ref 10–35)
Albumin: 4.6 g/dL (ref 3.6–5.1)
Alkaline phosphatase (APISO): 65 U/L (ref 37–153)
BUN/Creatinine Ratio: 14 (calc) (ref 6–22)
BUN: 15 mg/dL (ref 7–25)
CO2: 29 mmol/L (ref 20–32)
Calcium: 10 mg/dL (ref 8.6–10.4)
Chloride: 105 mmol/L (ref 98–110)
Creat: 1.09 mg/dL — ABNORMAL HIGH (ref 0.50–1.05)
Globulin: 3.2 g/dL (calc) (ref 1.9–3.7)
Glucose, Bld: 79 mg/dL (ref 65–99)
Potassium: 4 mmol/L (ref 3.5–5.3)
Sodium: 140 mmol/L (ref 135–146)
Total Bilirubin: 0.6 mg/dL (ref 0.2–1.2)
Total Protein: 7.8 g/dL (ref 6.1–8.1)

## 2020-09-26 ENCOUNTER — Ambulatory Visit (HOSPITAL_COMMUNITY): Payer: 59

## 2020-10-01 ENCOUNTER — Ambulatory Visit (HOSPITAL_COMMUNITY): Payer: 59

## 2020-10-07 NOTE — Telephone Encounter (Signed)
Looks like PA being requested for echocardiogram?  Also for labs?

## 2020-10-09 ENCOUNTER — Ambulatory Visit (HOSPITAL_COMMUNITY)
Admission: RE | Admit: 2020-10-09 | Discharge: 2020-10-09 | Disposition: A | Discharge status: Home / Self Care | Payer: 59 | Source: Ambulatory Visit | Attending: Sports Medicine | Admitting: Sports Medicine

## 2020-10-09 ENCOUNTER — Other Ambulatory Visit: Payer: Self-pay

## 2020-10-09 DIAGNOSIS — I088 Other rheumatic multiple valve diseases: Secondary | ICD-10-CM | POA: Insufficient documentation

## 2020-10-09 DIAGNOSIS — R011 Cardiac murmur, unspecified: Secondary | ICD-10-CM | POA: Diagnosis not present

## 2020-10-09 LAB — ECHOCARDIOGRAM COMPLETE
Area-P 1/2: 4.08 cm2
Calc EF: 58.5 %
S' Lateral: 3 cm
Single Plane A2C EF: 59.9 %
Single Plane A4C EF: 59.5 %

## 2020-10-09 NOTE — Progress Notes (Signed)
  Echocardiogram 2D Echocardiogram has been performed.  Stacy Young 10/09/2020, 8:43 AM

## 2021-02-25 ENCOUNTER — Telehealth: Payer: Self-pay

## 2021-02-25 NOTE — Telephone Encounter (Signed)
Do not need dermatology for this.

## 2021-02-25 NOTE — Telephone Encounter (Signed)
Patient reports finding a lump behind her ear yesterday. She wants to know should she come see you or go straight to dermatology? Please advise.

## 2021-02-25 NOTE — Telephone Encounter (Signed)
Patient aware Dr. Karie Schwalbe can see this to assess. Call transferred to the front for scheduling.

## 2021-03-04 ENCOUNTER — Ambulatory Visit (INDEPENDENT_AMBULATORY_CARE_PROVIDER_SITE_OTHER): Payer: 59 | Admitting: Sports Medicine

## 2021-03-04 ENCOUNTER — Other Ambulatory Visit: Payer: Self-pay

## 2021-03-04 DIAGNOSIS — Z711 Person with feared health complaint in whom no diagnosis is made: Secondary | ICD-10-CM

## 2021-03-04 NOTE — Assessment & Plan Note (Signed)
Stacy Young is a pleasant 55 year old female, she noted a potential lump behind her left earlobe, on exam there is no abnormal lump, she is simply referring to part of her auricular cartilage, she has a symmetric appearance on the contralateral side, nontender, she will leave it alone and return as needed, no charge for this visit

## 2021-03-04 NOTE — Progress Notes (Signed)
° ° °  Procedures performed today:    None.  Independent interpretation of notes and tests performed by another provider:   None.  Brief History, Exam, Impression, and Recommendations:    Worried well Stacy Young is a pleasant 55 year old female, she noted a potential lump behind her left earlobe, on exam there is no abnormal lump, she is simply referring to part of her auricular cartilage, she has a symmetric appearance on the contralateral side, nontender, she will leave it alone and return as needed, no charge for this visit    ___________________________________________ Ihor Austin. Benjamin Stain, M.D., ABFM., CAQSM. Primary Care and Sports Medicine [facility]  Adjunct Instructor of Family Medicine  [employer]

## 2021-05-05 ENCOUNTER — Ambulatory Visit: Payer: 59 | Admitting: Physician Assistant

## 2021-05-06 ENCOUNTER — Telehealth (INDEPENDENT_AMBULATORY_CARE_PROVIDER_SITE_OTHER): Payer: 59 | Admitting: Sports Medicine

## 2021-05-06 DIAGNOSIS — H02843 Edema of right eye, unspecified eyelid: Secondary | ICD-10-CM

## 2021-05-06 MED ORDER — ERYTHROMYCIN 5 MG/GM OP OINT: 1.0000 "application " | TOPICAL_OINTMENT | Freq: Three times a day (TID) | OPHTHALMIC | 0 refills | Status: AC

## 2021-05-06 NOTE — Progress Notes (Signed)
° °  Virtual Visit via Telephone   I connected with  Stacy Young  on 05/06/21 by telephone/telehealth and verified that I am speaking with the correct person using two identifiers.   I discussed the limitations, risks, security and privacy concerns of performing an evaluation and management service by telephone, including the higher likelihood of inaccurate diagnosis and treatment, and the availability of in person appointments.  We also discussed the likely need of an additional face to face encounter for complete and high quality delivery of care.  I also discussed with the patient that there may be a patient responsible charge related to this service. The patient expressed understanding and wishes to proceed.  Provider location is in medical facility. Patient location is at their home, different from provider location. People involved in care of the patient during this telehealth encounter were myself, my nurse/medical assistant, and my front office/scheduling team member.  Review of Systems: No fevers, chills, night sweats, weight loss, chest pain, or shortness of breath.   Objective Findings:    General: Speaking full sentences, no audible heavy breathing.  Sounds alert and appropriately interactive.    Independent interpretation of tests performed by another provider:   None.  Brief History, Exam, Impression, and Recommendations:    Swelling of eyelid, right Stacy Young is a pleasant 56 year old female, she is noted a recent history of swelling in her right upper eyelid, mild irritation, she does feel a lump under the eyelid. Vision is for the most part normal. She says she saw a different eye provider in January and her eye pressures were normal. As this is a telephone visit I was unable to conduct a physical exam however this sounds like a chalazion, we will add topical antibiotics, she will do some warm compresses, and I would like her to see Dr. Doristine Section downstairs next week.   I  discussed the above assessment and treatment plan with the patient. The patient was provided an opportunity to ask questions and all were answered. The patient agreed with the plan and demonstrated an understanding of the instructions.   The patient was advised to call back or seek an in-person evaluation if the symptoms worsen or if the condition fails to improve as anticipated.   I provided 30 minutes of verbal and non-verbal time during this encounter date, time was needed to gather information, review chart, records, communicate/coordinate with staff remotely, as well as complete documentation.   ___________________________________________ Ihor Austin. Benjamin Stain, M.D., ABFM., CAQSM. Primary Care and Sports Medicine [place] [facility]  Adjunct Professor of Family Medicine  [employer]

## 2021-05-06 NOTE — Assessment & Plan Note (Signed)
Stacy Young is a pleasant 56 year old female, she is noted a recent history of swelling in her right upper eyelid, mild irritation, she does feel a lump under the eyelid. Vision is for the most part normal. She says she saw a different eye provider in January and her eye pressures were normal. As this is a telephone visit I was unable to conduct a physical exam however this sounds like a chalazion, we will add topical antibiotics, she will do some warm compresses, and I would like her to see Dr. Doristine Section downstairs next week.

## 2021-08-03 ENCOUNTER — Telehealth: Payer: Self-pay

## 2021-08-03 DIAGNOSIS — Z1211 Encounter for screening for malignant neoplasm of colon: Secondary | ICD-10-CM

## 2021-08-03 NOTE — Telephone Encounter (Signed)
Patient aware of referral

## 2021-08-03 NOTE — Telephone Encounter (Signed)
Referral placed for colonoscopy. 

## 2021-08-03 NOTE — Telephone Encounter (Signed)
Patient received her cologuard testing kit in the mail. After reading the instructions, she would rather have a colonoscopy.  ?

## 2021-10-22 LAB — HM COLONOSCOPY

## 2021-11-04 ENCOUNTER — Encounter: Payer: Self-pay | Admitting: Emergency Medicine

## 2021-11-04 ENCOUNTER — Ambulatory Visit
Admission: EM | Admit: 2021-11-04 | Discharge: 2021-11-04 | Disposition: A | Discharge status: Home / Self Care | Payer: 59

## 2021-11-04 ENCOUNTER — Telehealth: Payer: 59 | Admitting: Family Medicine

## 2021-11-04 DIAGNOSIS — U071 COVID-19: Secondary | ICD-10-CM | POA: Diagnosis not present

## 2021-11-04 DIAGNOSIS — J309 Allergic rhinitis, unspecified: Secondary | ICD-10-CM

## 2021-11-04 MED ORDER — MOLNUPIRAVIR EUA 200MG CAPSULE: 4.0000 | ORAL_CAPSULE | Freq: Two times a day (BID) | ORAL | 0 refills | Status: AC

## 2021-11-04 MED ORDER — FEXOFENADINE HCL 180 MG PO TABS: 180.0000 mg | ORAL_TABLET | Freq: Every day | ORAL | 0 refills | Status: DC

## 2021-11-04 NOTE — Discharge Instructions (Addendum)
Advised patient to take medication as directed with food to completion.  Advised patient to take Allegra with first dose of Molnupiravir for the next 5 days.  Advised may use as needed afterwards for concurrent postnasal drip/drainage advised patient to remain self quarantine and for 5 days or until Monday, 11/09/2021.

## 2021-11-04 NOTE — ED Provider Notes (Signed)
[facility]    CSN: [id number] Arrival date & time: 11/04/21  1039      History   Chief Complaint Chief Complaint  Patient presents with   Nasal Congestion   Otalgia    right    HPI Stacy Young is a 56 y.o. female.   HPI 56 year old female presents with sinus congestion for 1 week and right ear pain since flying yesterday.  Patient reports COVID-19 positive yesterday on home test.  PMH significant for fatigue, history of pelvic mass, and endometriosis.  Past Medical History:  Diagnosis Date   BV (bacterial vaginosis) 2003   Dermoid cyst 10/2001   left   Endometriosis 01/2002   H/O dysmenorrhea 08/07/2003   H/O fatigue 08/2003   H/O pelvic mass 06/2010   H/O varicella    Hx of abnormal menstrual cycle 10/2001   Hx of constipation 06/2010   Hx: UTI (urinary tract infection) 04/2006   Irregular periods/menstrual cycles 09/2007   Monilia infection 06/2006   PMS (premenstrual syndrome) 10/2004   Vaginal dryness 2002    Patient Active Problem List   Diagnosis Date Noted   Swelling of eyelid, right 05/06/2021   Worried well 03/04/2021   Elevated blood pressure reading 09/08/2020   Palpitations 02/25/2017   Systolic murmur 04/06/2016   Allergic conjunctivitis 03/04/2016   Menopausal syndrome 04/03/2015   Left lumbar radiculopathy 01/30/2015   Leukopenia 08/14/2014   Annual physical exam 08/13/2014   Arthritis of carpometacarpal [facility]) joint of right thumb 08/13/2014   Seborrheic dermatitis of scalp 08/13/2014   Endometriosis 08/19/2011    Past Surgical History:  Procedure Laterality Date   CYSTECTOMY  01/10/2007   DILATION AND CURETTAGE OF UTERUS  2000   WISDOM TOOTH EXTRACTION  10/1998    OB History     Gravida  2   Para  1   Term  1   Preterm      AB  1   Living  1      SAB  1   IAB      Ectopic      Multiple      Live Births  1            Home Medications    Prior to Admission medications   Medication Sig Start Date End  Date Taking? Authorizing Provider  fexofenadine (ALLEGRA ALLERGY) 180 MG tablet Take 1 tablet (180 mg total) by mouth daily for 15 days. 11/04/21 11/19/21 Yes Trevor Iha, FNP  molnupiravir EUA (LAGEVRIO) 200 mg CAPS capsule Take 4 capsules (800 mg total) by mouth 2 (two) times daily for 5 days. 11/04/21 11/09/21 Yes Trevor Iha, FNP  prednisoLONE acetate (PRED FORTE) 1 % ophthalmic suspension SMARTSIG:In Eye(s) 10/28/21   [provider]  Probiotic Product (PROBIOTIC ACIDOPHILUS PO) Take by mouth.    [provider]    Family History Family History  Problem Relation Age of Onset   Hypertension Mother    Heart disease Father        MI   Hypertension Father    Stroke Father    Breast cancer Other     Social History Social History   Tobacco Use   Smoking status: Never   Smokeless tobacco: Never  Vaping Use   Vaping Use: Never used  Substance Use Topics   Alcohol use: No   Drug use: No     Allergies   Codeine, Darvocet [propoxyphene n-acetaminophen], Latex, and Naproxen   Review of Systems  Review of Systems  HENT:  Positive for congestion and ear pain.   All other systems reviewed and are negative.    Physical Exam Triage Vital Signs ED Triage Vitals  Enc Vitals Group     BP 11/04/21 1055 122/79     Pulse Rate 11/04/21 1055 87     Resp 11/04/21 1055 16     Temp 11/04/21 1055 99.7 F (37.6 C)     Temp Source 11/04/21 1055 Oral     SpO2 11/04/21 1055 98 %     Weight 11/04/21 1059 140 lb (63.5 kg)     Height 11/04/21 1059 5\' 7"  (1.702 m)     Head Circumference --      Peak Flow --      Pain Score 11/04/21 1058 3     Pain Loc --      Pain Edu? --      Excl. in [place]? --    No data found.  Updated Vital Signs BP 122/79 (BP Location: Left Arm)   Pulse 87   Temp 99.7 F (37.6 C) (Oral)   Resp 16   Ht 5\' 7"  (1.702 m)   Wt 140 lb (63.5 kg)   SpO2 98%   BMI 21.93 kg/m      Physical Exam Vitals and nursing note reviewed.   Constitutional:      Appearance: Normal appearance. She is normal weight.  HENT:     Head: Normocephalic and atraumatic.     Right Ear: Tympanic membrane and external ear normal.     Left Ear: Tympanic membrane and external ear normal.     Ears:     Comments: Moderate to significant eustachian tube dysfunction noted bilaterally    Mouth/Throat:     Mouth: Mucous membranes are moist.     Pharynx: Oropharynx is clear.     Comments: Moderate amount of clear drainage of posterior oropharynx noted Eyes:     Extraocular Movements: Extraocular movements intact.     Conjunctiva/sclera: Conjunctivae normal.     Pupils: Pupils are equal, round, and reactive to light.  Cardiovascular:     Rate and Rhythm: Normal rate and regular rhythm.     Pulses: Normal pulses.     Heart sounds: Normal heart sounds. No murmur heard. Pulmonary:     Effort: Pulmonary effort is normal.     Breath sounds: Normal breath sounds. No wheezing, rhonchi or rales.  Musculoskeletal:     Cervical back: Normal range of motion and neck supple.  Skin:    General: Skin is warm and dry.  Neurological:     General: No focal deficit present.     Mental Status: She is alert and oriented to person, place, and time.      UC Treatments / Results  Labs (all labs ordered are listed, but only abnormal results are displayed) Labs Reviewed - No data to display  EKG   Radiology No results found.  Procedures Procedures (including critical care time)  Medications Ordered in UC Medications - No data to display  Initial Impression / Assessment and Plan / UC Course  I have reviewed the triage vital signs and the nursing notes.  Pertinent labs & imaging results that were available during my care of the patient were reviewed by me and considered in my medical decision making (see chart for details).     MDM: 1. COVID-19-Rx'd Molnupiravir.  Advised patient to remain self quarantine for 5 days or until Monday, 11/09/2021;  2.  Allergic rhinitis-Rx'd Allegra. Advised patient to take medication as directed with food to completion.  Advised patient to take Allegra with first dose of Molnupiravir for the next 5 days.  Advised may use as needed afterwards for concurrent postnasal drip/drainage advised patient to remain self quarantine and for 5 days or until Monday, 11/09/2021.  Patient discharged home, hemodynamically stable. Final Clinical Impressions(s) / UC Diagnoses   Final diagnoses:  COVID-19  Allergic rhinitis, unspecified seasonality, unspecified trigger     Discharge Instructions      Advised patient to take medication as directed with food to completion.  Advised patient to take Allegra with first dose of Molnupiravir for the next 5 days.  Advised may use as needed afterwards for concurrent postnasal drip/drainage advised patient to remain self quarantine and for 5 days or until Monday, 11/09/2021.     ED Prescriptions     Medication Sig Dispense Auth. Provider   molnupiravir EUA (LAGEVRIO) 200 mg CAPS capsule Take 4 capsules (800 mg total) by mouth 2 (two) times daily for 5 days. 40 capsule Trevor Iha, FNP   fexofenadine [facility] ALLERGY) 180 MG tablet Take 1 tablet (180 mg total) by mouth daily for 15 days. 15 tablet Trevor Iha, FNP      PDMP not reviewed this encounter.   Trevor Iha, FNP 11/04/21 1126

## 2021-11-04 NOTE — ED Triage Notes (Signed)
Sinus congestion x 1 week  Returned from a cruise  yesterday COVID test was positive yesterday  Right ear pain since flying yesterday

## 2021-11-05 ENCOUNTER — Telehealth: Payer: Self-pay | Admitting: Emergency Medicine

## 2021-11-05 NOTE — Telephone Encounter (Signed)
Spoke with patient, states that she is taken the medicine as prescribed.  Patient is having some difficulty with breathing because she's up moving around more today.  Advised patient that this is a respiratory virus and she needs to listen to her body and rest.  Give her body time to heal, drink plenty of fluids.  If she is feeling really winded or not well, will need to go to the ED for f/u.  Patient states she will see how she does and go from there.

## 2021-11-20 ENCOUNTER — Telehealth: Payer: Self-pay

## 2021-11-20 MED ORDER — FEXOFENADINE-PSEUDOEPHED ER 180-240 MG PO TB24: 1.0000 | ORAL_TABLET | Freq: Every day | ORAL | 3 refills | Status: DC

## 2021-11-20 NOTE — Telephone Encounter (Signed)
Patient was advised to take Allegra D and wants a prescription for this so it will be cheaper. She stated that it is too expensive OTC.

## 2021-11-20 NOTE — Telephone Encounter (Signed)
Not a problem, Rx sent

## 2021-12-18 ENCOUNTER — Encounter: Payer: Self-pay | Admitting: Sports Medicine

## 2022-01-17 ENCOUNTER — Ambulatory Visit
Admission: EM | Admit: 2022-01-17 | Discharge: 2022-01-17 | Disposition: A | Discharge status: Home / Self Care | Payer: 59 | Attending: Family Medicine | Admitting: Family Medicine

## 2022-01-17 ENCOUNTER — Encounter: Payer: Self-pay | Admitting: Emergency Medicine

## 2022-01-17 DIAGNOSIS — R030 Elevated blood-pressure reading, without diagnosis of hypertension: Secondary | ICD-10-CM

## 2022-01-17 DIAGNOSIS — B9689 Other specified bacterial agents as the cause of diseases classified elsewhere: Secondary | ICD-10-CM

## 2022-01-17 DIAGNOSIS — J019 Acute sinusitis, unspecified: Secondary | ICD-10-CM | POA: Diagnosis not present

## 2022-01-17 DIAGNOSIS — J3089 Other allergic rhinitis: Secondary | ICD-10-CM

## 2022-01-17 MED ORDER — PREDNISONE 20 MG PO TABS: 40.0000 mg | ORAL_TABLET | Freq: Every day | ORAL | 0 refills | Status: DC

## 2022-01-17 MED ORDER — AMOXICILLIN-POT CLAVULANATE 875-125 MG PO TABS: 1.0000 | ORAL_TABLET | Freq: Two times a day (BID) | ORAL | 0 refills | Status: DC

## 2022-01-17 NOTE — ED Provider Notes (Signed)
[facility]    CSN: [id number] Arrival date & time: 01/17/22  0240      History   Chief Complaint Chief Complaint  Patient presents with   Sinus Infection    HPI Stacy Young is a 56 y.o. female.   HPI  Patient is here for upper respiratory symptoms.  She has underlying allergies.  She is having sinus pressure and pain, purulent drainage, some chest tightness its been going on for over a week and not getting better with her usual Allegra-D.  She is concerned for sinus infection.  Has not done COVID test  Past Medical History:  Diagnosis Date   BV (bacterial vaginosis) 2003   Dermoid cyst 10/2001   left   Endometriosis 01/2002   H/O dysmenorrhea 08/07/2003   H/O fatigue 08/2003   H/O pelvic mass 06/2010   H/O varicella    Hx of abnormal menstrual cycle 10/2001   Hx of constipation 06/2010   Hx: UTI (urinary tract infection) 04/2006   Irregular periods/menstrual cycles 09/2007   Monilia infection 06/2006   PMS (premenstrual syndrome) 10/2004   Vaginal dryness 2002    Patient Active Problem List   Diagnosis Date Noted   Swelling of eyelid, right 05/06/2021   Worried well 03/04/2021   Elevated blood pressure reading 09/08/2020   Palpitations 02/25/2017   Systolic murmur 04/06/2016   Allergic conjunctivitis 03/04/2016   Menopausal syndrome 04/03/2015   Left lumbar radiculopathy 01/30/2015   Leukopenia 08/14/2014   Annual physical exam 08/13/2014   Arthritis of carpometacarpal [facility]) joint of right thumb 08/13/2014   Seborrheic dermatitis of scalp 08/13/2014   Endometriosis 08/19/2011    Past Surgical History:  Procedure Laterality Date   CYSTECTOMY  01/10/2007   DILATION AND CURETTAGE OF UTERUS  2000   WISDOM TOOTH EXTRACTION  10/1998    OB History     Gravida  2   Para  1   Term  1   Preterm      AB  1   Living  1      SAB  1   IAB      Ectopic      Multiple      Live Births  1            Home Medications    Prior to  Admission medications   Medication Sig Start Date End Date Taking? Authorizing Provider  amoxicillin-clavulanate (AUGMENTIN) 875-125 MG tablet Take 1 tablet by mouth every 12 (twelve) hours. 01/17/22  Yes Eustace Moore, MD  fexofenadine-pseudoephedrine (ALLEGRA-D ALLERGY & CONGESTION) 180-240 MG 24 hr tablet Take 1 tablet by mouth daily. 11/20/21  Yes Monica Becton, MD  predniSONE (DELTASONE) 20 MG tablet Take 2 tablets (40 mg total) by mouth daily with breakfast. 01/17/22  Yes Eustace Moore, MD  Probiotic Product (PROBIOTIC ACIDOPHILUS PO) Take by mouth.   Yes [provider]    Family History Family History  Problem Relation Age of Onset   Hypertension Mother    Heart disease Father        MI   Hypertension Father    Stroke Father    Breast cancer Other     Social History Social History   Tobacco Use   Smoking status: Never   Smokeless tobacco: Never  Vaping Use   Vaping Use: Never used  Substance Use Topics   Alcohol use: No   Drug use: No     Allergies   Codeine,  Darvocet [propoxyphene n-acetaminophen], Latex, and Naproxen   Review of Systems Review of Systems  See HPI Physical Exam Triage Vital Signs ED Triage Vitals  Enc Vitals Group     BP 01/17/22 0821 (!) 165/92     Pulse Rate 01/17/22 0821 68     Resp 01/17/22 0821 18     Temp 01/17/22 0821 98.9 F (37.2 C)     Temp Source 01/17/22 0821 Oral     SpO2 01/17/22 0821 100 %     Weight 01/17/22 0822 135 lb (61.2 kg)     Height 01/17/22 0822 5\' 7"  (1.702 m)     Head Circumference --      Peak Flow --      Pain Score 01/17/22 0822 0     Pain Loc --      Pain Edu? --      Excl. in [place]? --    No data found.  Updated Vital Signs BP (!) 157/88 (BP Location: Left Arm)   Pulse 70   Temp 98.9 F (37.2 C) (Oral)   Resp 18   Ht 5\' 7"  (1.702 m)   Wt 61.2 kg   SpO2 100%   BMI 21.14 kg/m      Physical Exam Constitutional:      General: She is not in acute distress.     Appearance: She is well-developed and normal weight. She is ill-appearing.  HENT:     Head: Normocephalic and atraumatic.     Right Ear: Tympanic membrane and ear canal normal.     Left Ear: Tympanic membrane and ear canal normal.     Nose: Congestion and rhinorrhea present.     Mouth/Throat:     Pharynx: Posterior oropharyngeal erythema present.  Eyes:     Conjunctiva/sclera: Conjunctivae normal.     Pupils: Pupils are equal, round, and reactive to light.  Cardiovascular:     Rate and Rhythm: Normal rate and regular rhythm.     Heart sounds: Normal heart sounds.  Pulmonary:     Effort: Pulmonary effort is normal. No respiratory distress.     Breath sounds: Normal breath sounds. No wheezing.  Abdominal:     General: There is no distension.     Palpations: Abdomen is soft.  Musculoskeletal:        General: Normal range of motion.     Cervical back: Normal range of motion.  Lymphadenopathy:     Cervical: Cervical adenopathy present.  Skin:    General: Skin is warm and dry.  Neurological:     Mental Status: She is alert.  Psychiatric:        Mood and Affect: Mood normal.        Behavior: Behavior normal.      UC Treatments / Results  Labs (all labs ordered are listed, but only abnormal results are displayed) Labs Reviewed - No data to display  EKG   Radiology No results found.  Procedures Procedures (including critical care time)  Medications Ordered in UC Medications - No data to display  Initial Impression / Assessment and Plan / UC Course  I have reviewed the triage vital signs and the nursing notes.  Pertinent labs & imaging results that were available during my care of the patient were reviewed by me and considered in my medical decision making (see chart for details).     Final Clinical Impressions(s) / UC Diagnoses   Final diagnoses:  Acute bacterial sinusitis  Environmental and seasonal allergies  Elevated blood pressure reading in office without  diagnosis of hypertension     Discharge Instructions      Take Augmentin 2 times a day for a week Take prednisone daily for 5 days.  This will help reduce sinus pressure and chest tightness Try to drink more water  Your blood pressure was elevated when you arrived today.  Follow-up with your primary care doctor if this continues   ED Prescriptions     Medication Sig Dispense Auth. Provider   amoxicillin-clavulanate (AUGMENTIN) 875-125 MG tablet Take 1 tablet by mouth every 12 (twelve) hours. 14 tablet Eustace Moore, MD   predniSONE (DELTASONE) 20 MG tablet Take 2 tablets (40 mg total) by mouth daily with breakfast. 10 tablet Eustace Moore, MD      PDMP not reviewed this encounter.   Eustace Moore, MD 01/17/22 [phone]

## 2022-01-17 NOTE — Discharge Instructions (Signed)
Take Augmentin 2 times a day for a week Take prednisone daily for 5 days.  This will help reduce sinus pressure and chest tightness Try to drink more water  Your blood pressure was elevated when you arrived today.  Follow-up with your primary care doctor if this continues

## 2022-01-17 NOTE — ED Triage Notes (Signed)
Patient c/o sinus drainage, chest congestion, slight cough x 1 week.  Patient has taken allergy meds and an OTC decongestant.

## 2022-10-08 ENCOUNTER — Ambulatory Visit (INDEPENDENT_AMBULATORY_CARE_PROVIDER_SITE_OTHER): Payer: 59 | Admitting: Sports Medicine

## 2022-10-08 VITALS — BP 165/93 | HR 77 | Wt 142.0 lb

## 2022-10-08 DIAGNOSIS — F411 Generalized anxiety disorder: Secondary | ICD-10-CM

## 2022-10-08 DIAGNOSIS — R002 Palpitations: Secondary | ICD-10-CM | POA: Diagnosis not present

## 2022-10-08 DIAGNOSIS — R030 Elevated blood-pressure reading, without diagnosis of hypertension: Secondary | ICD-10-CM | POA: Diagnosis not present

## 2022-10-08 DIAGNOSIS — Z0000 Encounter for general adult medical examination without abnormal findings: Secondary | ICD-10-CM

## 2022-10-08 DIAGNOSIS — D72819 Decreased white blood cell count, unspecified: Secondary | ICD-10-CM | POA: Diagnosis not present

## 2022-10-08 NOTE — Assessment & Plan Note (Signed)
Elevated today in office, she needs to check it at home and send me a diary.

## 2022-10-08 NOTE — Assessment & Plan Note (Signed)
CPE as above, UTD on cervical and breast CA screening, done at her OBGYN late last year, we will try to get records. Interested in [employer], has cruise tomorrow and will get it done after, will send to pharmacy.

## 2022-10-08 NOTE — Assessment & Plan Note (Signed)
Anxiety centers around her daughter, husband is a Education officer, environmental, some stress from the church duties as well. Discussed exercise, behavioral, and pharmacotherapy.   Proceeding with behavioral therapy.

## 2022-10-08 NOTE — Assessment & Plan Note (Signed)
New onset palpitations, no CP, occurs at rest, no presyncope.  Had holter in the past but feels like this is different, we will order another Zio.

## 2022-10-08 NOTE — Progress Notes (Signed)
Subjective:    CC: Annual Physical Exam  HPI:  This patient is here for their annual physical  I reviewed the past medical history, family history, social history, surgical history, and allergies today and no changes were needed.  Please see the problem list section below in epic for further details.  Past Medical History: Past Medical History:  Diagnosis Date   BV (bacterial vaginosis) 2003   Dermoid cyst 10/2001   left   Endometriosis 01/2002   H/O dysmenorrhea 08/07/2003   H/O fatigue 08/2003   H/O pelvic mass 06/2010   H/O varicella    Hx of abnormal menstrual cycle 10/2001   Hx of constipation 06/2010   Hx: UTI (urinary tract infection) 04/2006   Irregular periods/menstrual cycles 09/2007   Monilia infection 06/2006   PMS (premenstrual syndrome) 10/2004   Vaginal dryness 2002   Past Surgical History: Past Surgical History:  Procedure Laterality Date   CYSTECTOMY  01/10/2007   DILATION AND CURETTAGE OF UTERUS  2000   WISDOM TOOTH EXTRACTION  10/1998   Social History: Social History   Socioeconomic History   Marital status: Married    Spouse name: Not on file   Number of children: Not on file   Years of education: Not on file   Highest education level: Not on file  Occupational History   Occupation: Deputy    Employer: [employer]  Tobacco Use   Smoking status: Never   Smokeless tobacco: Never  Vaping Use   Vaping status: Never Used  Substance and Sexual Activity   Alcohol use: No   Drug use: No   Sexual activity: Yes    Birth control/protection: I.U.D.    Comment: MIRENA  Other Topics Concern   Not on file  Social History Narrative   Not on file   Social Determinants of Health   Financial Resource Strain: Not on file  Food Insecurity: Not on file  Transportation Needs: Not on file  Physical Activity: Not on file  Stress: Not on file  Social Connections: Unknown (08/05/2021)   [facility]   Social Network    Social  Network: Not on file   Family History: Family History  Problem Relation Age of Onset   Hypertension Mother    Heart disease Father        MI   Hypertension Father    Stroke Father    Breast cancer Other    Allergies: Allergies  Allergen Reactions   Codeine Nausea And Vomiting   Darvocet [Propoxyphene N-Acetaminophen] Nausea Only   Latex Hives   Naproxen     Can not recall reaction per pt    Medications: See med rec.  Review of Systems: No headache, visual changes, nausea, vomiting, diarrhea, constipation, dizziness, abdominal pain, skin rash, fevers, chills, night sweats, swollen lymph nodes, weight loss, chest pain, body aches, joint swelling, muscle aches, shortness of breath, mood changes, visual or auditory hallucinations.  Objective:    General: Well Developed, well nourished, and in no acute distress.  Neuro: Alert and oriented x3, extra-ocular muscles intact, sensation grossly intact. Cranial nerves II through XII are intact, motor, sensory, and coordinative functions are all intact. HEENT: Normocephalic, atraumatic, pupils equal round reactive to light, neck supple, no masses, no lymphadenopathy, thyroid nonpalpable. Oropharynx, nasopharynx, external ear canals are unremarkable. Skin: Warm and dry, no rashes noted.  Cardiac: Regular rate and rhythm, no murmurs rubs or gallops.  Respiratory: Clear to auscultation bilaterally. Not using accessory muscles,  speaking in full sentences.  Abdominal: Soft, nontender, nondistended, positive bowel sounds, no masses, no organomegaly.  Musculoskeletal: Shoulder, elbow, wrist, hip, knee, ankle stable, and with full range of motion.  Impression and Recommendations:    The patient was counselled, risk factors were discussed, anticipatory guidance given.  Annual physical exam CPE as above, UTD on cervical and breast CA screening, done at her OBGYN late last year, we will try to get records. Interested in [employer], has cruise  tomorrow and will get it done after, will send to pharmacy.   Palpitations New onset palpitations, no CP, occurs at rest, no presyncope.  Had holter in the past but feels like this is different, we will order another Zio.  Generalized anxiety disorder Anxiety centers around her daughter, husband is a Education officer, environmental, some stress from the church duties as well. Discussed exercise, behavioral, and pharmacotherapy.   Proceeding with behavioral therapy.  Elevated blood pressure reading Elevated today in office, she needs to check it at home and send me a diary.   ____________________________________________ Ihor Austin. Benjamin Stain, M.D., ABFM., CAQSM., AME. Primary Care and Sports Medicine [facility]  Adjunct Professor of Family Medicine  [place] [employer] [facility] [employer]  Restaurant manager, fast food

## 2022-10-09 LAB — COMPREHENSIVE METABOLIC PANEL
ALT: 21 IU/L (ref 0–32)
AST: 30 IU/L (ref 0–40)
Albumin: 4.7 g/dL (ref 3.8–4.9)
Alkaline Phosphatase: 99 IU/L (ref 44–121)
BUN/Creatinine Ratio: 12 (ref 9–23)
BUN: 11 mg/dL (ref 6–24)
Bilirubin Total: 0.3 mg/dL (ref 0.0–1.2)
CO2: 24 mmol/L (ref 20–29)
Calcium: 10.1 mg/dL (ref 8.7–10.2)
Chloride: 100 mmol/L (ref 96–106)
Creatinine, Ser: 0.9 mg/dL (ref 0.57–1.00)
Globulin, Total: 2.9 g/dL (ref 1.5–4.5)
Glucose: 85 mg/dL (ref 70–99)
Potassium: 4 mmol/L (ref 3.5–5.2)
Sodium: 139 mmol/L (ref 134–144)
Total Protein: 7.6 g/dL (ref 6.0–8.5)
eGFR: 76 mL/min/{1.73_m2} (ref >59)

## 2022-10-09 LAB — CBC
Hematocrit: 39.3 % (ref 34.0–46.6)
Hemoglobin: 13.1 g/dL (ref 11.1–15.9)
MCH: 28.7 pg (ref 26.6–33.0)
MCHC: 33.3 g/dL (ref 31.5–35.7)
MCV: 86 fL (ref 79–97)
Platelets: 238 10*3/uL (ref 150–450)
RBC: 4.56 x10E6/uL (ref 3.77–5.28)
RDW: 13 % (ref 11.7–15.4)
WBC: 3.4 10*3/uL (ref 3.4–10.8)

## 2022-10-09 LAB — LIPID PANEL
Chol/HDL Ratio: 2.2 ratio (ref 0.0–4.4)
Cholesterol, Total: 197 mg/dL (ref 100–199)
HDL: 91 mg/dL (ref >39)
LDL Chol Calc (NIH): 98 mg/dL (ref 0–99)
Triglycerides: 40 mg/dL (ref 0–149)
VLDL Cholesterol Cal: 8 mg/dL (ref 5–40)

## 2022-10-09 LAB — TSH: TSH: 1.99 u[IU]/mL (ref 0.450–4.500)

## 2022-10-09 LAB — HEMOGLOBIN A1C
Est. average glucose Bld gHb Est-mCnc: 114 mg/dL
Hgb A1c MFr Bld: 5.6 % (ref 4.8–5.6)

## 2022-10-11 ENCOUNTER — Ambulatory Visit: Payer: 59 | Attending: Sports Medicine

## 2022-10-11 DIAGNOSIS — R002 Palpitations: Secondary | ICD-10-CM

## 2022-10-11 NOTE — Progress Notes (Unsigned)
Enrolled for [employer] to mail a ZIO XT long term holter monitor to the patients address on file.   DOD to read. 

## 2022-10-20 DIAGNOSIS — R002 Palpitations: Secondary | ICD-10-CM

## 2022-11-02 ENCOUNTER — Ambulatory Visit: Payer: 59 | Admitting: Sports Medicine

## 2022-11-05 ENCOUNTER — Ambulatory Visit: Payer: 59 | Admitting: Sports Medicine

## 2022-11-12 ENCOUNTER — Ambulatory Visit: Payer: 59 | Admitting: Sports Medicine

## 2022-11-12 ENCOUNTER — Encounter: Payer: Self-pay | Admitting: Sports Medicine

## 2022-11-12 VITALS — BP 167/84 | HR 82 | Resp 20 | Ht 67.0 in | Wt 147.1 lb

## 2022-11-12 DIAGNOSIS — I10 Essential (primary) hypertension: Secondary | ICD-10-CM | POA: Diagnosis not present

## 2022-11-12 DIAGNOSIS — Z23 Encounter for immunization: Secondary | ICD-10-CM | POA: Diagnosis not present

## 2022-11-12 DIAGNOSIS — N951 Menopausal and female climacteric states: Secondary | ICD-10-CM

## 2022-11-12 MED ORDER — AMLODIPINE BESYLATE 5 MG PO TABS: 5.0000 mg | ORAL_TABLET | Freq: Every day | ORAL | 3 refills | Status: DC

## 2022-11-12 NOTE — Assessment & Plan Note (Signed)
Stacy Young has had borderline blood pressure for some time now, it is typically been high here in the office, we had her do some logs at home and most of her blood pressure readings were over 140/90. She has tried some dietary changes, for this reason we will start a blood pressure medicine and give her a diagnosis of hypertension. Starting amlodipine 5 mg daily, she will return 2 weeks nurse visit for blood pressure check.

## 2022-11-12 NOTE — Progress Notes (Signed)
    Procedures performed today:    None.  Independent interpretation of notes and tests performed by another provider:   None.  Brief History, Exam, Impression, and Recommendations:    Benign essential hypertension Satomi has had borderline blood pressure for some time now, it is typically been high here in the office, we had her do some logs at home and most of her blood pressure readings were over 140/90. She has tried some dietary changes, for this reason we will start a blood pressure medicine and give her a diagnosis of hypertension. Starting amlodipine 5 mg daily, she will return 2 weeks nurse visit for blood pressure check.  Menopausal syndrome Vasomotor instability, patient did go through menopause at about 26, she is interested in treatment for her hot flashes, she tried black cohosh over-the-counter without efficacy. We discussed SSRIs, gabapentin and Veozah. Patient would like to read about it and think about it. My recommendation for her was Veozah.  Chronic process not at goal with pharmacologic intervention  ____________________________________________ Ihor Austin. Benjamin Stain, M.D., ABFM., CAQSM., AME. Primary Care and Sports Medicine [facility]  Adjunct Professor of Family Medicine  [place] [employer] [facility] [employer]  Restaurant manager, fast food

## 2022-11-12 NOTE — Assessment & Plan Note (Signed)
Vasomotor instability, patient did go through menopause at about 39, she is interested in treatment for her hot flashes, she tried black cohosh over-the-counter without efficacy. We discussed SSRIs, gabapentin and Veozah. Patient would like to read about it and think about it. My recommendation for her was Veozah.

## 2022-11-29 ENCOUNTER — Ambulatory Visit (INDEPENDENT_AMBULATORY_CARE_PROVIDER_SITE_OTHER): Payer: 59

## 2022-11-29 ENCOUNTER — Telehealth: Payer: Self-pay | Admitting: Sports Medicine

## 2022-11-29 VITALS — BP 137/68 | HR 77

## 2022-11-29 DIAGNOSIS — I10 Essential (primary) hypertension: Secondary | ICD-10-CM | POA: Diagnosis not present

## 2022-11-29 NOTE — Telephone Encounter (Signed)
I always take first-degree relatives of existing patients.

## 2022-11-29 NOTE — Telephone Encounter (Signed)
Pt wants to know if you will see her healthy 55 year old daughter as a primary care patient?

## 2022-11-29 NOTE — Progress Notes (Signed)
Patient no having any pains or discomforts.Patient started amlodipine 5 mg on Sunday November 14, 2022.Patient doesn't report any complications on medication. Dr.T. wants patient to continue medication as prescribed. Patient doesn't need to make any appointments for B/P checks unless something chanes

## 2022-12-01 NOTE — Telephone Encounter (Signed)
I called this pt and she will have her 55 year old daughter to call and schedule the new patient appt with Dr.T

## 2023-01-21 LAB — RESULTS CONSOLE HPV: CHL HPV: NEGATIVE

## 2023-01-21 LAB — HM PAP SMEAR: HM Pap smear: NORMAL

## 2023-02-10 ENCOUNTER — Ambulatory Visit: Payer: 59 | Admitting: Sports Medicine

## 2023-02-10 ENCOUNTER — Encounter: Payer: Self-pay | Admitting: Sports Medicine

## 2023-02-10 DIAGNOSIS — M5416 Radiculopathy, lumbar region: Secondary | ICD-10-CM | POA: Diagnosis not present

## 2023-02-10 DIAGNOSIS — L819 Disorder of pigmentation, unspecified: Secondary | ICD-10-CM | POA: Diagnosis not present

## 2023-02-10 DIAGNOSIS — S93491A Sprain of other ligament of right ankle, initial encounter: Secondary | ICD-10-CM

## 2023-02-10 DIAGNOSIS — Z0000 Encounter for general adult medical examination without abnormal findings: Secondary | ICD-10-CM | POA: Diagnosis not present

## 2023-02-10 DIAGNOSIS — S93401A Sprain of unspecified ligament of right ankle, initial encounter: Secondary | ICD-10-CM | POA: Insufficient documentation

## 2023-02-10 NOTE — Assessment & Plan Note (Signed)
Coming later to get her next Shingrix shot, she is up-to-date on cervical and breast cancer screening and she will get the records for [place] from her OB/GYN.

## 2023-02-10 NOTE — Assessment & Plan Note (Signed)
Multilevel lumbar DDD, she is having a flare of pain, she has started some home conditioning and is starting to improve, she will do this for 4 weeks and then switch to the advanced herniated disc conditioning. If this does not work then she can return and we can consider additional imaging and medication.

## 2023-02-10 NOTE — Assessment & Plan Note (Signed)
Inverted ankle approximately a week ago, pain at the ATFL, otherwise good motion, good strength, good stability, adding ankle sprain rehab exercises, she can return as needed for this. Negative for [place] ankle rules.

## 2023-02-10 NOTE — Assessment & Plan Note (Signed)
Would like a consultation with dermatology for discussion of treatment of hyperpigmented macules over the face.

## 2023-02-10 NOTE — Progress Notes (Signed)
    Procedures performed today:    None.  Independent interpretation of notes and tests performed by another provider:   None.  Brief History, Exam, Impression, and Recommendations:    Annual physical exam Coming later to get her next Shingrix shot, she is up-to-date on cervical and breast cancer screening and she will get the records for [place] from her OB/GYN.  Left lumbar radiculopathy Multilevel lumbar DDD, she is having a flare of pain, she has started some home conditioning and is starting to improve, she will do this for 4 weeks and then switch to the advanced herniated disc conditioning. If this does not work then she can return and we can consider additional imaging and medication.  Right ankle sprain Inverted ankle approximately a week ago, pain at the ATFL, otherwise good motion, good strength, good stability, adding ankle sprain rehab exercises, she can return as needed for this. Negative for [place] ankle rules.  Hyperpigmentation of skin of cheek Would like a consultation with dermatology for discussion of treatment of hyperpigmented macules over the face.    ____________________________________________ Ihor Austin. Benjamin Stain, M.D., ABFM., CAQSM., AME. Primary Care and Sports Medicine [facility]  Adjunct Professor of Family Medicine  [employer]  Restaurant manager, fast food

## 2023-02-11 ENCOUNTER — Encounter: Payer: Self-pay | Admitting: Sports Medicine

## 2023-02-16 ENCOUNTER — Ambulatory Visit (INDEPENDENT_AMBULATORY_CARE_PROVIDER_SITE_OTHER): Payer: 59

## 2023-02-16 VITALS — BP 137/80 | HR 85 | Ht 67.0 in | Wt 148.1 lb

## 2023-02-16 DIAGNOSIS — Z23 Encounter for immunization: Secondary | ICD-10-CM | POA: Diagnosis not present

## 2023-02-16 NOTE — Progress Notes (Signed)
HPI  Pt comes in today for 2nd shingles vaccine. Pt tolerated well, no redness, swelling.                Plan and Assessment:  No follow up neede

## 2023-03-09 ENCOUNTER — Other Ambulatory Visit: Payer: Self-pay | Admitting: Sports Medicine

## 2023-03-09 DIAGNOSIS — I10 Essential (primary) hypertension: Secondary | ICD-10-CM

## 2023-03-24 ENCOUNTER — Ambulatory Visit: Payer: 59 | Admitting: Sports Medicine

## 2023-05-17 ENCOUNTER — Telehealth: Payer: Self-pay | Admitting: Sports Medicine

## 2023-05-17 NOTE — Telephone Encounter (Signed)
 Copied from CRM [id number]. Topic: General - Call Back - No Documentation >> May 17, 2023  8:37 AM Higinio Roger wrote: Reason for CRM: Patient is requesting a callback from Dr. Karie Schwalbe. Patient wants to speak about a ball to sit on at work. HR (Relations Manager) is saying she is not allowed to have one. Callback #: [phone].

## 2023-05-23 NOTE — Telephone Encounter (Signed)
Called patient.voicemail box is full

## 2023-05-24 ENCOUNTER — Telehealth: Payer: Self-pay | Admitting: Sports Medicine

## 2023-05-24 NOTE — Telephone Encounter (Signed)
 Copied from CRM [id number]. Topic: Clinical - Medical Advice >> May 23, 2023  2:54 PM Hamdi H wrote: Reason for CRM: Patient is requesting a callback from Dr. Karie Schwalbe. Patient wants to speak about a ball to sit on at work. HR (Relations Manager) is saying she is not allowed to have one. Callback #: [phone] this is her work number, she will not be available to answer during her lunch hour which is usually 1:45- 2:45 PM. Pt is hoping to have this resolved over the phone and she's hoping she won't have to come in to be seen for this. She would like you to know she is sorry for missing the last phone call.

## 2023-07-02 ENCOUNTER — Other Ambulatory Visit: Payer: Self-pay | Admitting: Sports Medicine

## 2023-07-02 DIAGNOSIS — I10 Essential (primary) hypertension: Secondary | ICD-10-CM

## 2023-08-26 ENCOUNTER — Encounter: Payer: Self-pay | Admitting: Sports Medicine

## 2023-08-26 ENCOUNTER — Ambulatory Visit: Admitting: Sports Medicine

## 2023-08-26 VITALS — BP 132/71 | HR 71 | Resp 20 | Ht 67.0 in | Wt 153.0 lb

## 2023-08-26 DIAGNOSIS — M67449 Ganglion, unspecified hand: Secondary | ICD-10-CM | POA: Diagnosis not present

## 2023-08-26 NOTE — Assessment & Plan Note (Signed)
 Stacy Young has noticed a cyst right thumb interphalangeal joint, on exam there is a palpable, movable, nontender digital mucinous cyst, we discussed the benign nature of this, we can leave this alone for now.

## 2023-08-26 NOTE — Progress Notes (Signed)
    Procedures performed today:    None.  Independent interpretation of notes and tests performed by another provider:   None.  Brief History, Exam, Impression, and Recommendations:    Digital mucinous cyst right thumb Stacy Young has noticed a cyst right thumb interphalangeal joint, on exam there is a palpable, movable, nontender digital mucinous cyst, we discussed the benign nature of this, we can leave this alone for now.    ____________________________________________ [name]. [name], M.D., ABFM., CAQSM., AME. Primary Care and Sports Medicine [facility]  Adjunct Professor of [facility]  [employer]  Restaurant manager, fast food

## 2023-11-10 ENCOUNTER — Ambulatory Visit (INDEPENDENT_AMBULATORY_CARE_PROVIDER_SITE_OTHER): Admitting: Sports Medicine

## 2023-11-10 ENCOUNTER — Encounter: Payer: Self-pay | Admitting: Sports Medicine

## 2023-11-10 VITALS — BP 136/97 | HR 89 | Resp 20 | Ht 67.0 in | Wt 155.0 lb

## 2023-11-10 DIAGNOSIS — Z0000 Encounter for general adult medical examination without abnormal findings: Secondary | ICD-10-CM | POA: Diagnosis not present

## 2023-11-10 DIAGNOSIS — N951 Menopausal and female climacteric states: Secondary | ICD-10-CM

## 2023-11-10 DIAGNOSIS — I10 Essential (primary) hypertension: Secondary | ICD-10-CM

## 2023-11-10 DIAGNOSIS — R635 Abnormal weight gain: Secondary | ICD-10-CM

## 2023-11-10 NOTE — Assessment & Plan Note (Signed)
 Annual physical as above, ordering mammogram, up-to-date on colon cancer screening, Pap smear with HPV cotesting was negative November of last year. Ordering routine labs.

## 2023-11-10 NOTE — Assessment & Plan Note (Signed)
 That he has noted a great deal of weight gain over the past few months, she blames this on the munchies. I did advise her that food chatter needed more of a behavioral modification type treatment, we will have her work with healthy weight and wellness.

## 2023-11-10 NOTE — Progress Notes (Signed)
 Subjective:    CC: Annual Physical Exam  HPI:  This patient is here for their annual physical  I reviewed the past medical history, family history, social history, surgical history, and allergies today and no changes were needed.  Please see the problem list section below in epic for further details.  Past Medical History: Past Medical History:  Diagnosis Date   BV (bacterial vaginosis) 2003   Dermoid cyst 10/2001   left   Endometriosis 01/2002   H/O dysmenorrhea 08/07/2003   H/O fatigue 08/2003   H/O pelvic mass 06/2010   H/O varicella    Hx of abnormal menstrual cycle 10/2001   Hx of constipation 06/2010   Hx: UTI (urinary tract infection) 04/2006   Irregular periods/menstrual cycles 09/2007   Monilia infection 06/2006   PMS (premenstrual syndrome) 10/2004   Vaginal dryness 2002   Past Surgical History: Past Surgical History:  Procedure Laterality Date   CYSTECTOMY  01/10/2007   DILATION AND CURETTAGE OF UTERUS  2000   WISDOM TOOTH EXTRACTION  10/1998   Social History: Social History   Socioeconomic History   Marital status: Married    Spouse name: Not on file   Number of children: Not on file   Years of education: Not on file   Highest education level: Not on file  Occupational History   Occupation: Deputy    Employer: [employer]  Tobacco Use   Smoking status: Never   Smokeless tobacco: Never  Vaping Use   Vaping status: Never Used  Substance and Sexual Activity   Alcohol use: No   Drug use: No   Sexual activity: Yes    Birth control/protection: I.U.D.    Comment: MIRENA   Other Topics Concern   Not on file  Social History Narrative   Not on file   Social Drivers of Health   Financial Resource Strain: Not on file  Food Insecurity: Not on file  Transportation Needs: Not on file  Physical Activity: Not on file  Stress: Not on file  Social Connections: Unknown (08/05/2021)   Received from [facility]   Social Network    Social Network: Not on file    Family History: Family History  Problem Relation Age of Onset   Hypertension Mother    Heart disease Father        MI   Hypertension Father    Stroke Father    Breast cancer Other    Allergies: Allergies  Allergen Reactions   Codeine Nausea And Vomiting   Darvocet [Propoxyphene N-Acetaminophen] Nausea Only   Latex Hives   Naproxen     Can not recall reaction per pt    Medications: See med rec.  Review of Systems: No headache, visual changes, nausea, vomiting, diarrhea, constipation, dizziness, abdominal pain, skin rash, fevers, chills, night sweats, swollen lymph nodes, weight loss, chest pain, body aches, joint swelling, muscle aches, shortness of breath, mood changes, visual or auditory hallucinations.  Objective:    General: Well Developed, well nourished, and in no acute distress.  Neuro: Alert and oriented x3, extra-ocular muscles intact, sensation grossly intact. Cranial nerves II through XII are intact, motor, sensory, and coordinative functions are all intact. HEENT: Normocephalic, atraumatic, pupils equal round reactive to light, neck supple, no masses, no lymphadenopathy, thyroid  nonpalpable. Oropharynx, nasopharynx, external ear canals are unremarkable. Skin: Warm and dry, no rashes noted.  Cardiac: Regular rate and rhythm, no murmurs rubs or gallops.  Respiratory: Clear to auscultation bilaterally. Not using accessory muscles, speaking in  full sentences.  Abdominal: Soft, nontender, nondistended, positive bowel sounds, no masses, no organomegaly.  Musculoskeletal: Shoulder, elbow, wrist, hip, knee, ankle stable, and with full range of motion.  Impression and Recommendations:    The patient was counselled, risk factors were discussed, anticipatory guidance given.  Annual physical exam Annual physical as above, ordering mammogram, up-to-date on colon cancer screening, Pap smear with HPV cotesting was negative November of last year. Ordering routine  labs.  Abnormal weight gain That he has noted a great deal of weight gain over the past few months, she blames this on the munchies. I did advise her that food chatter needed more of a behavioral modification type treatment, we will have her work with healthy weight and wellness.  Menopausal syndrome Having some hot flashes, we did discuss Veozah, she is going to try some over-the-counter treatments first and then we can do Veozah if no better.   ____________________________________________ Debby PARAS. Curtis, M.D., ABFM., CAQSM., AME. Primary Care and Sports Medicine [facility]  Adjunct Professor of [facility] Medicine  [employer]  Restaurant manager, fast food

## 2023-11-10 NOTE — Assessment & Plan Note (Signed)
 Having some hot flashes, we did discuss Veozah, she is going to try some over-the-counter treatments first and then we can do Veozah if no better.

## 2023-11-19 LAB — COMPREHENSIVE METABOLIC PANEL WITH GFR
ALT: 12 IU/L (ref 0–32)
AST: 25 IU/L (ref 0–40)
Albumin: 4.4 g/dL (ref 3.8–4.9)
Alkaline Phosphatase: 97 IU/L (ref 44–121)
BUN/Creatinine Ratio: 16 (ref 9–23)
BUN: 14 mg/dL (ref 6–24)
Bilirubin Total: 0.4 mg/dL (ref 0.0–1.2)
CO2: 21 mmol/L (ref 20–29)
Calcium: 9.8 mg/dL (ref 8.7–10.2)
Chloride: 103 mmol/L (ref 96–106)
Creatinine, Ser: 0.85 mg/dL (ref 0.57–1.00)
Globulin, Total: 2.5 g/dL (ref 1.5–4.5)
Glucose: 81 mg/dL (ref 70–99)
Potassium: 4.1 mmol/L (ref 3.5–5.2)
Sodium: 139 mmol/L (ref 134–144)
Total Protein: 6.9 g/dL (ref 6.0–8.5)
eGFR: 81 mL/min/1.73 (ref >59)

## 2023-11-19 LAB — CBC
Hematocrit: 39.1 % (ref 34.0–46.6)
Hemoglobin: 12.7 g/dL (ref 11.1–15.9)
MCH: 28.8 pg (ref 26.6–33.0)
MCHC: 32.5 g/dL (ref 31.5–35.7)
MCV: 89 fL (ref 79–97)
Platelets: 257 x10E3/uL (ref 150–450)
RBC: 4.41 x10E6/uL (ref 3.77–5.28)
RDW: 13.2 % (ref 11.7–15.4)
WBC: 3.9 x10E3/uL (ref 3.4–10.8)

## 2023-11-19 LAB — LIPID PANEL
Chol/HDL Ratio: 2.4 ratio (ref 0.0–4.4)
Cholesterol, Total: 183 mg/dL (ref 100–199)
HDL: 76 mg/dL (ref >39)
LDL Chol Calc (NIH): 98 mg/dL (ref 0–99)
Triglycerides: 42 mg/dL (ref 0–149)
VLDL Cholesterol Cal: 9 mg/dL (ref 5–40)

## 2023-11-19 LAB — HEMOGLOBIN A1C
Est. average glucose Bld gHb Est-mCnc: 117 mg/dL
Hgb A1c MFr Bld: 5.7 % — ABNORMAL HIGH (ref 4.8–5.6)

## 2023-11-19 LAB — ABO AND RH: Rh Factor: POSITIVE

## 2023-11-19 LAB — TSH: TSH: 1.31 u[IU]/mL (ref 0.450–4.500)

## 2023-11-22 ENCOUNTER — Encounter: Payer: Self-pay | Admitting: Sports Medicine

## 2023-11-22 ENCOUNTER — Telehealth: Payer: Self-pay

## 2023-11-22 NOTE — Telephone Encounter (Signed)
Please result labs

## 2023-11-22 NOTE — Telephone Encounter (Signed)
 Copied from CRM #[id number]. Topic: Clinical - Lab/Test Results >> Nov 22, 2023  9:45 AM Carmell SAUNDERS wrote: Reason for CRM: Pt received lab results via mychart and would like to go over them. [phone]

## 2023-11-23 NOTE — Telephone Encounter (Signed)
 Dr. ONEIDA patient needing labs resulted.

## 2023-11-25 ENCOUNTER — Telehealth: Payer: Self-pay

## 2023-11-25 NOTE — Telephone Encounter (Signed)
 Patient called, no answer, mailbox not set up. Has questions regarding lab results, will need a call back.    Copied from CRM #[id number]. Topic: Clinical - Medical Advice >> Nov 25, 2023  2:31 PM Kevelyn M wrote: Reason for CRM: patient would like to [place] about message in [place].  Call back #[phone] Work # [phone]

## 2023-11-28 NOTE — Telephone Encounter (Signed)
 Patient informed. - she scheduled for 01/13/2024 for [facility] visit with Whitney crain.

## 2023-11-28 NOTE — Telephone Encounter (Signed)
 I thought I had already sent pt a message about this. Her A1C is at the beginning stages of prediabetes, but no treatment needed other than monitoring sugar intake. All other labs are normal

## 2024-01-13 ENCOUNTER — Encounter: Payer: Self-pay | Admitting: Urgent Care

## 2024-01-13 ENCOUNTER — Ambulatory Visit: Admitting: Sports Medicine

## 2024-01-13 ENCOUNTER — Ambulatory Visit: Admitting: Urgent Care

## 2024-01-13 VITALS — BP 136/74 | HR 69 | Ht 67.0 in | Wt 151.0 lb

## 2024-01-13 DIAGNOSIS — S90852A Superficial foreign body, left foot, initial encounter: Secondary | ICD-10-CM

## 2024-01-13 DIAGNOSIS — I10 Essential (primary) hypertension: Secondary | ICD-10-CM | POA: Diagnosis not present

## 2024-01-13 DIAGNOSIS — R7303 Prediabetes: Secondary | ICD-10-CM | POA: Diagnosis not present

## 2024-01-13 DIAGNOSIS — Z23 Encounter for immunization: Secondary | ICD-10-CM

## 2024-01-13 DIAGNOSIS — N951 Menopausal and female climacteric states: Secondary | ICD-10-CM | POA: Diagnosis not present

## 2024-01-13 MED ORDER — MUPIROCIN 2 % EX OINT: TOPICAL_OINTMENT | CUTANEOUS | 3 refills | Status: AC

## 2024-01-13 NOTE — Patient Instructions (Addendum)
 I removed the object from your left foot. Please use the topical antibiotic ointment on your foot three times daily for the next three days.  Continue to monitor your blood pressure at home - you are at goal. [facility] job on the weight loss. Try to eat standard meals rather than grazing. Higher fiber and higher protein meals will help you feel full. Drink lots of water.  Return for annual PE in August, sooner if needed.

## 2024-01-13 NOTE — Progress Notes (Signed)
 Established Patient Office Visit  Subjective:  Patient ID: Stacy Young, female    DOB: Nov 27, 1965  Age: 58 y.o. MRN: [id number]  Chief Complaint  Patient presents with   Establish Care    Splinter in left foot    HPI  Discussed the use of AI scribe software for clinical note transcription with the patient, who gave verbal consent to proceed.  History of Present Illness   Stacy Young is a 58 year old female who presents with a foreign body in her foot and concerns about blood pressure management.  She has had a foreign object in her foot since September following a trip to [place] . She attempted self-removal but was unable to fully extract it, leading to soreness and difficulty walking. She has tried using charcoal and salt paste to draw it out, with partial success.  She is concerned about her blood pressure, which was slightly elevated during a previous visit. She is currently taking amlodipine . She attributes some of her blood pressure issues to dietary habits, particularly snacking on high-salt foods like potato chips. She has been working on [employer] and has lost four pounds recently.  She describes a tendency to snack out of boredom, particularly at home, and has been trying to reduce junk food intake. She has been following dietary advice from a holistic doctor, including taking probiotics and avoiding junk food. Her BMI is 23, and she has been trying to maintain her weight post-menopause.  She mentions experiencing menopausal symptoms such as weight gain, difficulty thinking, and sweating, which she attributes to menopause. She has tried various remedies, including black cohosh and Yam Yam cream, with limited success. Her last menstrual period was about a year ago, marking the onset of menopause. She previously used a Mirena  IUD for birth control, which was removed a year or two ago.  Her recent blood work showed an A1c of 5.7, indicating prediabetes. She has  since lost weight. Other lab results, including cholesterol, thyroid , kidney, liver function, and CBC, were normal.      Patient Active Problem List   Diagnosis Date Noted   Abnormal weight gain 11/10/2023   Digital mucinous cyst right thumb 08/26/2023   Right ankle sprain 02/10/2023   Generalized anxiety disorder 10/08/2022   Worried well 03/04/2021   Benign essential hypertension 09/08/2020   Palpitations 02/25/2017   Systolic murmur 04/06/2016   Allergic conjunctivitis 03/04/2016   Menopausal syndrome 04/03/2015   Left lumbar radiculopathy 01/30/2015   Hyperpigmentation of skin of cheek 09/10/2014   Leukopenia 08/14/2014   Annual physical exam 08/13/2014   Arthritis of carpometacarpal [facility]) joint of right thumb 08/13/2014   Seborrheic dermatitis of scalp 08/13/2014   Endometriosis 08/19/2011   Past Medical History:  Diagnosis Date   Allergy 1980   BV (bacterial vaginosis) 03/22/2001   Dermoid cyst 10/20/2001   left   Endometriosis 01/20/2002   H/O dysmenorrhea 08/07/2003   H/O fatigue 08/21/2003   H/O pelvic mass 06/21/2010   H/O varicella    Heart murmur 2020   Hx of abnormal menstrual cycle 10/20/2001   Hx of constipation 06/21/2010   Hx: UTI (urinary tract infection) 04/22/2006   Irregular periods/menstrual cycles 09/20/2007   Monilia infection 06/21/2006   PMS (premenstrual syndrome) 10/20/2004   Vaginal dryness 03/22/2000   Past Surgical History:  Procedure Laterality Date   CYSTECTOMY  01/10/2007   DILATION AND CURETTAGE OF UTERUS  2000   WISDOM TOOTH EXTRACTION  10/1998   Social History  Tobacco Use   Smoking status: Never   Smokeless tobacco: Never  Vaping Use   Vaping status: Never Used  Substance Use Topics   Alcohol use: No   Drug use: No      ROS: as noted in HPI  Objective:     BP 136/74   Pulse 69   Ht 5' 7 (1.702 m)   Wt 151 lb (68.5 kg)   SpO2 100%   BMI 23.65 kg/m  BP Readings from Last 3 Encounters:  01/13/24 136/74   11/10/23 (!) 136/97  08/26/23 132/71   Wt Readings from Last 3 Encounters:  01/13/24 151 lb (68.5 kg)  11/10/23 155 lb (70.3 kg)  08/26/23 153 lb (69.4 kg)      Physical Exam Vitals and nursing note reviewed.  Constitutional:      General: She is not in acute distress.    Appearance: Normal appearance. She is not ill-appearing, toxic-appearing or diaphoretic.  HENT:     Head: Normocephalic and atraumatic.  Eyes:     General: No scleral icterus.       Right eye: No discharge.        Left eye: No discharge.     Extraocular Movements: Extraocular movements intact.     Pupils: Pupils are equal, round, and reactive to light.  Cardiovascular:     Rate and Rhythm: Normal rate.  Pulmonary:     Effort: Pulmonary effort is normal. No respiratory distress.  Musculoskeletal:        General: Tenderness present.       Feet:  Skin:    General: Skin is warm and dry.     Coloration: Skin is not jaundiced.     Findings: No bruising, erythema or rash.  Neurological:     General: No focal deficit present.     Mental Status: She is alert and oriented to person, place, and time.     Gait: Gait normal.  Psychiatric:        Mood and Affect: Mood normal.        Behavior: Behavior normal.    Foreign Body Removal  Date/Time: 01/13/2024 3:10 PM  Performed by: Lowella Benton CROME, PA Authorized by: Lowella Benton L, PA  Intake: left foot plantar surface laterally. Complexity: simple 1 objects recovered. Post-procedure assessment: foreign body removed Patient tolerance: patient tolerated the procedure well with no immediate complications Comments: Area cleaned with povidone iodine swabs and alcohol prep pads. Using a TB needle inserted superficially, was able to advance FB externally and grabbed with tweezers. Bandaid was applied.     No results found for any visits on 01/13/24.  Last CBC Lab Results  Component Value Date   WBC 3.9 11/18/2023   HGB 12.7 11/18/2023   HCT 39.1  11/18/2023   MCV 89 11/18/2023   MCH 28.8 11/18/2023   RDW 13.2 11/18/2023   PLT 257 11/18/2023   Last metabolic panel Lab Results  Component Value Date   GLUCOSE 81 11/18/2023   NA 139 11/18/2023   K 4.1 11/18/2023   CL 103 11/18/2023   CO2 21 11/18/2023   BUN 14 11/18/2023   CREATININE 0.85 11/18/2023   EGFR 81 11/18/2023   CALCIUM 9.8 11/18/2023   PROT 6.9 11/18/2023   ALBUMIN 4.4 11/18/2023   LABGLOB 2.5 11/18/2023   BILITOT 0.4 11/18/2023   ALKPHOS 97 11/18/2023   AST 25 11/18/2023   ALT 12 11/18/2023   Last lipids Lab Results  Component Value Date  CHOL 183 11/18/2023   HDL 76 11/18/2023   LDLCALC 98 11/18/2023   TRIG 42 11/18/2023   CHOLHDL 2.4 11/18/2023   Last hemoglobin A1c Lab Results  Component Value Date   HGBA1C 5.7 (H) 11/18/2023   Last thyroid  functions Lab Results  Component Value Date   TSH 1.310 11/18/2023   Last vitamin D  Lab Results  Component Value Date   VD25OH 31 04/07/2017   Last vitamin B12 and Folate Lab Results  Component Value Date   VITAMINB12 572 04/03/2015   FOLATE >20.0 04/03/2015      The 10-year ASCVD risk score (Arnett DK, et al., 2019) is: 4.1%  Assessment & Plan:  Menopausal syndrome  Benign essential hypertension  Foreign body in left foot, initial encounter -     Mupirocin; Apply to affected area TID for 3 days  Dispense: 30 g; Refill: 3  Prediabetes  Other orders -     Foreign Body Removal   Assessment and Plan    Foreign body in right foot Foreign body partially extruded, causing soreness and difficulty walking. Appears superficial. - Clean area thoroughly. - Removed FB with small needle without incision. - Advise salt soaks at home. - Prescribe topical antibiotic ointment tid to prevent infection.  Essential hypertension Blood pressure at goal but slightly elevated compared to patients previous. This is influenced by diet, weight, and menopause. Current medication: amlodipine . Recent weight  loss may improve control. - Encourage further weight loss to reduce systolic blood pressure. - Advise reducing salt intake, especially from snacks.  Prediabetes A1c at 5.7 indicates prediabetes. Recent weight loss may improve status. A1c recheck in three months. - Reassess A1c in three months. - Encourage continued weight management and healthy eating.  Menopausal symptoms Symptoms include weight gain, cognitive difficulty, and sweating since stopping Mirena  and entering menopause. Previous treatments ineffective. - Consider menopausal complex with black cohosh, vitamin E, and evening primrose oil.  General Health Maintenance Due for pneumococcal vaccination. Mammogram scheduled. Declined COVID-19 vaccination. - Will update Prevnar 20 vaccine at a future visit - Ensure mammogram completion on December 29.      Total time spent including face to face time, chart review, lab interpretation, documentation and extensive dietary/ exercise counseling including lifestyle modifications was 35 minutes   Return in about 10 months (around 11/12/2024) for Annual Physical.   Benton LITTIE Gave, PA

## 2024-02-18 ENCOUNTER — Ambulatory Visit
Admission: EM | Admit: 2024-02-18 | Discharge: 2024-02-18 | Disposition: A | Discharge status: Home / Self Care | Attending: Internal Medicine | Admitting: Internal Medicine

## 2024-02-18 ENCOUNTER — Other Ambulatory Visit: Payer: Self-pay

## 2024-02-18 ENCOUNTER — Ambulatory Visit

## 2024-02-18 DIAGNOSIS — S90852A Superficial foreign body, left foot, initial encounter: Secondary | ICD-10-CM

## 2024-02-18 NOTE — Discharge Instructions (Signed)
 X-ray is pending.  I will call you with the results.  Podiatry referral has been placed.  If they do not call you by Tuesday, please call them yourself to schedule an appointment.

## 2024-02-18 NOTE — ED Triage Notes (Signed)
 Reports has splinter to left foot. Has been present for a month. Has had aching to left leg. Area is not red per pt. Was seen by pcp and given cream for it. Has tried epsom salt.

## 2024-02-18 NOTE — ED Provider Notes (Signed)
 [facility]    CSN: [id number] Arrival date & time: 02/18/24  1026      History   Chief Complaint Chief Complaint  Patient presents with   Foreign Body in Skin    HPI Stacy Young is a 58 y.o. female.   Patient presents with possible foreign body of left foot.  Foreign body had been present for multiple months.  She saw her PCP on 10/24 who removed foreign body.  Although, she is concerned that something is still there as there is a visible black mark to her foot.  She also reports that she has been having pain radiating up her leg so she is not sure if this is related.  Denies numbness or tingling.  Denies fever or drainage.  She was prescribed mupirocin  to apply topically by PCP.  She states that she was told by her PCP to not soak her foot.     Past Medical History:  Diagnosis Date   Allergy 1980   BV (bacterial vaginosis) 03/22/2001   Dermoid cyst 10/20/2001   left   Endometriosis 01/20/2002   H/O dysmenorrhea 08/07/2003   H/O fatigue 08/21/2003   H/O pelvic mass 06/21/2010   H/O varicella    Heart murmur 2020   Hx of abnormal menstrual cycle 10/20/2001   Hx of constipation 06/21/2010   Hx: UTI (urinary tract infection) 04/22/2006   Irregular periods/menstrual cycles 09/20/2007   Monilia infection 06/21/2006   PMS (premenstrual syndrome) 10/20/2004   Vaginal dryness 03/22/2000    Patient Active Problem List   Diagnosis Date Noted   Abnormal weight gain 11/10/2023   Digital mucinous cyst right thumb 08/26/2023   Right ankle sprain 02/10/2023   Generalized anxiety disorder 10/08/2022   Worried well 03/04/2021   Benign essential hypertension 09/08/2020   Palpitations 02/25/2017   Systolic murmur 04/06/2016   Allergic conjunctivitis 03/04/2016   Menopausal syndrome 04/03/2015   Left lumbar radiculopathy 01/30/2015   Hyperpigmentation of skin of cheek 09/10/2014   Leukopenia 08/14/2014   Annual physical exam 08/13/2014   Arthritis of  carpometacarpal [facility]) joint of right thumb 08/13/2014   Seborrheic dermatitis of scalp 08/13/2014   Endometriosis 08/19/2011    Past Surgical History:  Procedure Laterality Date   CYSTECTOMY  01/10/2007   DILATION AND CURETTAGE OF UTERUS  2000   WISDOM TOOTH EXTRACTION  10/1998    OB History     Gravida  2   Para  1   Term  1   Preterm      AB  1   Living  1      SAB  1   IAB      Ectopic      Multiple      Live Births  1            Home Medications    Prior to Admission medications   Medication Sig Start Date End Date Taking? Authorizing Provider  amLODipine  (NORVASC ) 5 MG tablet TAKE 1 TABLET (5 MG TOTAL) BY MOUTH DAILY. 07/05/23   Curtis Debby PARAS, MD  mupirocin  ointment (BACTROBAN ) 2 % Apply to affected area TID for 3 days 01/13/24   Lowella Folks L, PA  Probiotic Product (PROBIOTIC ACIDOPHILUS PO) Take by mouth.    [provider]  Specialty Vitamins Products (MENOPAUSE SUPPORT PO) Take by mouth daily.    [provider]    Family History Family History  Problem Relation Age of Onset   Hypertension Mother  Heart disease Father        MI   Hypertension Father    Stroke Father    Breast cancer Other     Social History Social History   Tobacco Use   Smoking status: Never   Smokeless tobacco: Never  Vaping Use   Vaping status: Never Used  Substance Use Topics   Alcohol use: No   Drug use: No     Allergies   Codeine, Darvocet [propoxyphene n-acetaminophen], Latex, and Naproxen   Review of Systems Review of Systems Per HPI  Physical Exam Triage Vital Signs ED Triage Vitals  Encounter Vitals Group     BP 02/18/24 1036 (!) 156/90     Girls Systolic BP Percentile --      Girls Diastolic BP Percentile --      Boys Systolic BP Percentile --      Boys Diastolic BP Percentile --      Pulse Rate 02/18/24 1036 82     Resp 02/18/24 1036 16     Temp 02/18/24 1036 98.1 F (36.7 C)     Temp src --       SpO2 02/18/24 1036 100 %     Weight --      Height --      Head Circumference --      Peak Flow --      Pain Score 02/18/24 1040 5     Pain Loc --      Pain Education --      Exclude from Growth Chart --    No data found.  Updated Vital Signs BP (!) 156/90   Pulse 82   Temp 98.1 F (36.7 C)   Resp 16   SpO2 100%   Visual Acuity Right Eye Distance:   Left Eye Distance:   Bilateral Distance:    Right Eye Near:   Left Eye Near:    Bilateral Near:     Physical Exam Constitutional:      General: She is not in acute distress.    Appearance: Normal appearance. She is not toxic-appearing or diaphoretic.  HENT:     Head: Normocephalic and atraumatic.  Eyes:     Extraocular Movements: Extraocular movements intact.     Conjunctiva/sclera: Conjunctivae normal.  Pulmonary:     Effort: Pulmonary effort is normal.  Musculoskeletal:       Feet:  Feet:     Comments: Pinpoint black discoloration noted to ball of left foot.  No palpable foreign body.  Neurovascularly intact.  No surrounding swelling or erythema.  No drainage noted.  Patient can wiggle toes and ambulates without difficulty. Neurological:     General: No focal deficit present.     Mental Status: She is alert and oriented to person, place, and time. Mental status is at baseline.  Psychiatric:        Mood and Affect: Mood normal.        Behavior: Behavior normal.        Thought Content: Thought content normal.        Judgment: Judgment normal.      UC Treatments / Results  Labs (all labs ordered are listed, but only abnormal results are displayed) Labs Reviewed - No data to display  EKG   Radiology DG Foot Complete Left Result Date: 02/18/2024 EXAM: 3 OR MORE VIEW(S) XRAY OF THE LEFT FOOT 02/18/2024 11:40:22 AM COMPARISON: None available. CLINICAL HISTORY: foreign body foreign body FINDINGS: BONES AND JOINTS: No acute fracture.  No focal osseous lesion. No joint dislocation. No radiopaque foreign body is  noted. SOFT TISSUES: The soft tissues are unremarkable. No radiopaque foreign body is noted. IMPRESSION: 1. No radiopaque foreign body is identified. Electronically signed by: Lynwood Seip MD 02/18/2024 12:18 PM EST RP Workstation: [id number]    Procedures Procedures (including critical care time)  Medications Ordered in UC Medications - No data to display  Initial Impression / Assessment and Plan / UC Course  I have reviewed the triage vital signs and the nursing notes.  Pertinent labs & imaging results that were available during my care of the patient were reviewed by me and considered in my medical decision making (see chart for details).     Possible foreign body versus scab formed from healing after foreign body removal by PCP. There are no signs of infection.  Discussed with patient that it will be best to follow-up with podiatry given persistent symptoms.  X-ray of foot was completed that was negative for any acute bony abnormality or notable foreign body.  Ambulatory referral to podiatry was placed but patient was educated that if she is not called for appointment, she is to call them herself at provided contact information.  Patient verbalized understanding and was agreeable with plan. Final Clinical Impressions(s) / UC Diagnoses   Final diagnoses:  Foreign body in left foot, initial encounter     Discharge Instructions      X-ray is pending.  I will call you with the results.  Podiatry referral has been placed.  If they do not call you by Tuesday, please call them yourself to schedule an appointment.    ED Prescriptions   None    PDMP not reviewed this encounter.   Hazen Darryle BRAVO, [place] 02/18/24 1240

## 2024-02-21 ENCOUNTER — Ambulatory Visit: Payer: Self-pay | Admitting: Internal Medicine

## 2024-02-29 ENCOUNTER — Telehealth: Payer: Self-pay

## 2024-02-29 NOTE — Telephone Encounter (Signed)
 Copied from CRM [id number]. Topic: Clinical - Medication Question >> Feb 29, 2024  1:46 PM Aleatha C wrote: Reason for CRM: Patient is looking to see about Mammogram and pap smear and if [facility] could do it or refer her to someone

## 2024-02-29 NOTE — Telephone Encounter (Signed)
 Informed patient that Dr. ONEIDA is at [facility], and that he placed an order for her to have her mammogram done 11/10/23 provided imaging's phone number for her to reschedule. Also advised patient that she wasn't due for her Pap smear until 2029. She voiced her understanding

## 2024-03-01 ENCOUNTER — Encounter: Payer: Self-pay | Admitting: Podiatry

## 2024-03-01 ENCOUNTER — Ambulatory Visit: Admitting: Podiatry

## 2024-03-01 DIAGNOSIS — S90852A Superficial foreign body, left foot, initial encounter: Secondary | ICD-10-CM

## 2024-03-01 NOTE — Progress Notes (Signed)
°  Subjective:  Patient ID: Stacy Young, female    DOB: [birth year],   MRN: [id number]  Chief Complaint  Patient presents with   Foot Pain    I had something stuck in my left foot.  They took xrays of it and took something out.  I don't know if it's still something in there but on my leg and sometime on this bone (5th met).    58 y.o. female presents for concern of left foot possible foreign body.  She relates on Labor Day she stepped on something and was seen by PCP and had some it pulled out.  She was seen in urgent care more recently after the pain had worsened and she felt like something was still in there.  She was advised to come follow-up here.  Recently it has not been bothering her as much but still feels like there is something in there.  She does have a history of sciatic nerve pain that has been worsening recently.. Denies any other pedal complaints. Denies n/v/f/c.   Past Medical History:  Diagnosis Date   Allergy 1980   BV (bacterial vaginosis) 03/22/2001   Dermoid cyst 10/20/2001   left   Endometriosis 01/20/2002   H/O dysmenorrhea 08/07/2003   H/O fatigue 08/21/2003   H/O pelvic mass 06/21/2010   H/O varicella    Heart murmur 2020   Hx of abnormal menstrual cycle 10/20/2001   Hx of constipation 06/21/2010   Hx: UTI (urinary tract infection) 04/22/2006   Irregular periods/menstrual cycles 09/20/2007   Monilia infection 06/21/2006   PMS (premenstrual syndrome) 10/20/2004   Vaginal dryness 03/22/2000    Objective:  Physical Exam: Vascular: DP/PT pulses 2/4 bilateral. CFT <3 seconds. Normal hair growth on digits. No edema.  Skin. No lacerations or abrasions bilateral feet.  Plantar fourth metatarsal head small lesion capillary budding noted.  Debrided and underlying skin intact.  No foreign bodies noted. Musculoskeletal: MMT 5/5 bilateral lower extremities in DF, PF, Inversion and Eversion. Deceased ROM in DF of ankle joint.  No tenderness in the fourth  intermetatarsal space negative metatarsal squeeze negative Mulder's click. Neurological: Sensation intact to light touch.   Assessment:   1. Foreign body in left foot, initial encounter      Plan:  Patient was evaluated and treated and all questions answered. Discussed foreign body versus radiculopathy versus neuroma and treatment options with patient.  Radiographs reviewed and discussed with patient.  No acute fractures or foreign bodies noted. Hyperkeratotic lesion overlying previous foreign body site debrided and no additional foreign body noted.  Skin intact. Discussed padding and offloading today.  Anti-inflammatories as needed Discussed if pain does not improve may consider  MRI for further surgical planning.  Patient to return as needed if recurring pain.    Asberry Failing, DPM

## 2024-03-26 ENCOUNTER — Other Ambulatory Visit: Payer: Self-pay | Admitting: Urgent Care

## 2024-03-26 DIAGNOSIS — I10 Essential (primary) hypertension: Secondary | ICD-10-CM

## 2024-03-26 NOTE — Telephone Encounter (Signed)
 Copied from CRM #[id number]. Topic: Clinical - Medication Refill >> Mar 26, 2024  3:21 PM Rosaria A wrote: Medication: amLODipine  (NORVASC ) 5 MG tablet  Has the patient contacted their pharmacy? Yes Per Pharmacy patient need a new prescription.   This is the patient's preferred pharmacy:  [id number] [place], [place] - [address] [place] [address] Phone: [phone] Fax: [phone]  Is this the correct pharmacy for this prescription? Yes   Has the prescription been filled recently? No  Is the patient out of the medication? Yes  Has the patient been seen for an appointment in the last year OR does the patient have an upcoming appointment? Yes  Can we respond through MyChart? No. Patient would like a call back.   Agent: Please be advised that Rx refills may take up to 3 business days. We ask that you follow-up with your pharmacy.

## 2024-03-27 MED ORDER — AMLODIPINE BESYLATE 5 MG PO TABS: 5.0000 mg | ORAL_TABLET | Freq: Every day | ORAL | 3 refills | Status: AC

## 2024-03-27 NOTE — Telephone Encounter (Signed)
 Requesting rx rf of Amlodipine  5mg   Last written 07/05/2203 for qty #60  refills 3- by Dr Curtis Last OV 01/13/2024 Upcoming appt 11/12/2024

## 2024-11-12 ENCOUNTER — Encounter: Admitting: Urgent Care
